# Patient Record
Sex: Male | Born: 1950 | Race: White | Hispanic: No | Smoking: Never smoker
Health system: Southern US, Community
[De-identification: ages and names within clinical notes are randomized; demographics above are authoritative.]

## PROBLEM LIST (undated history)

## (undated) DIAGNOSIS — J449 Chronic obstructive pulmonary disease, unspecified: Secondary | ICD-10-CM

## (undated) DIAGNOSIS — E785 Hyperlipidemia, unspecified: Secondary | ICD-10-CM

## (undated) DIAGNOSIS — I10 Essential (primary) hypertension: Secondary | ICD-10-CM

---

## 2003-07-11 ENCOUNTER — Encounter: Admission: RE | Admit: 2003-07-11 | Discharge: 2003-07-11 | Payer: Self-pay

## 2005-04-19 ENCOUNTER — Emergency Department (HOSPITAL_COMMUNITY): Admission: EM | Admit: 2005-04-19 | Discharge: 2005-04-19 | Payer: Self-pay | Admitting: Emergency Medicine

## 2005-12-25 ENCOUNTER — Emergency Department (HOSPITAL_COMMUNITY): Admission: EM | Admit: 2005-12-25 | Discharge: 2005-12-25 | Payer: Self-pay | Admitting: Family Medicine

## 2006-05-22 ENCOUNTER — Emergency Department (HOSPITAL_COMMUNITY): Admission: EM | Admit: 2006-05-22 | Discharge: 2006-05-22 | Payer: Self-pay | Admitting: Family Medicine

## 2012-10-16 ENCOUNTER — Other Ambulatory Visit: Payer: Self-pay

## 2012-10-16 ENCOUNTER — Emergency Department (HOSPITAL_COMMUNITY)
Admission: EM | Admit: 2012-10-16 | Discharge: 2012-10-17 | Discharge status: Against Medical Advice | Payer: Non-veteran care | Attending: Emergency Medicine | Admitting: Emergency Medicine

## 2012-10-16 ENCOUNTER — Emergency Department (HOSPITAL_COMMUNITY): Payer: Non-veteran care

## 2012-10-16 ENCOUNTER — Encounter (HOSPITAL_COMMUNITY): Payer: Self-pay | Admitting: Emergency Medicine

## 2012-10-16 DIAGNOSIS — J4489 Other specified chronic obstructive pulmonary disease: Secondary | ICD-10-CM | POA: Insufficient documentation

## 2012-10-16 DIAGNOSIS — R279 Unspecified lack of coordination: Secondary | ICD-10-CM | POA: Insufficient documentation

## 2012-10-16 DIAGNOSIS — Z79899 Other long term (current) drug therapy: Secondary | ICD-10-CM | POA: Insufficient documentation

## 2012-10-16 DIAGNOSIS — I10 Essential (primary) hypertension: Secondary | ICD-10-CM

## 2012-10-16 DIAGNOSIS — J441 Chronic obstructive pulmonary disease with (acute) exacerbation: Secondary | ICD-10-CM | POA: Insufficient documentation

## 2012-10-16 DIAGNOSIS — Z7982 Long term (current) use of aspirin: Secondary | ICD-10-CM | POA: Insufficient documentation

## 2012-10-16 DIAGNOSIS — J449 Chronic obstructive pulmonary disease, unspecified: Secondary | ICD-10-CM | POA: Insufficient documentation

## 2012-10-16 DIAGNOSIS — R42 Dizziness and giddiness: Secondary | ICD-10-CM

## 2012-10-16 DIAGNOSIS — E785 Hyperlipidemia, unspecified: Secondary | ICD-10-CM | POA: Insufficient documentation

## 2012-10-16 HISTORY — DX: Hyperlipidemia, unspecified: E78.5

## 2012-10-16 HISTORY — DX: Essential (primary) hypertension: I10

## 2012-10-16 HISTORY — DX: Chronic obstructive pulmonary disease, unspecified: J44.9

## 2012-10-16 LAB — CBC WITH DIFFERENTIAL/PLATELET
Basophils Absolute: 0 10*3/uL (ref 0.0–0.1)
Basophils Relative: 1 % (ref 0–1)
Eosinophils Relative: 5 % (ref 0–5)
HCT: 40.8 % (ref 39.0–52.0)
Hemoglobin: 14.2 g/dL (ref 13.0–17.0)
Lymphocytes Relative: 20 % (ref 12–46)
MCHC: 34.8 g/dL (ref 30.0–36.0)
MCV: 85.5 fL (ref 78.0–100.0)
Monocytes Absolute: 0.5 10*3/uL (ref 0.1–1.0)
Monocytes Relative: 6 % (ref 3–12)
Neutro Abs: 5.5 10*3/uL (ref 1.7–7.7)
RDW: 13.1 % (ref 11.5–15.5)

## 2012-10-16 LAB — COMPREHENSIVE METABOLIC PANEL
BUN: 22 mg/dL (ref 6–23)
CO2: 23 mEq/L (ref 19–32)
Calcium: 9.1 mg/dL (ref 8.4–10.5)
Chloride: 103 mEq/L (ref 96–112)
Creatinine, Ser: 1.94 mg/dL — ABNORMAL HIGH (ref 0.50–1.35)
GFR calc non Af Amer: 35 mL/min — ABNORMAL LOW (ref >90)
Total Bilirubin: 0.4 mg/dL (ref 0.3–1.2)

## 2012-10-16 LAB — POCT I-STAT TROPONIN I: Troponin i, poc: 0 ng/mL (ref 0.00–0.08)

## 2012-10-16 LAB — PRO B NATRIURETIC PEPTIDE: Pro B Natriuretic peptide (BNP): 140.5 pg/mL — ABNORMAL HIGH (ref 0–125)

## 2012-10-16 NOTE — ED Notes (Signed)
Patient transported to X-ray 

## 2012-10-16 NOTE — ED Notes (Signed)
Patient reports that he has had some mild shortness of breath over the last couple of weeks; increased in severity tonight in the cold weather.  Patient talking in short sentences; pausing between words.  History of COPD.  Denies chest pain.

## 2012-10-17 ENCOUNTER — Emergency Department (HOSPITAL_COMMUNITY): Payer: Non-veteran care

## 2012-10-17 MED ORDER — ACETAMINOPHEN 325 MG PO TABS
650.0000 mg | ORAL_TABLET | Freq: Once | ORAL | Status: AC
  Administered 2012-10-17: 650 mg via ORAL
  Filled 2012-10-17: qty 2

## 2012-10-17 MED ORDER — ASPIRIN 81 MG PO CHEW
324.0000 mg | CHEWABLE_TABLET | Freq: Once | ORAL | Status: AC
  Administered 2012-10-17: 324 mg via ORAL

## 2012-10-17 MED ORDER — ASPIRIN 81 MG PO CHEW
CHEWABLE_TABLET | ORAL | Status: AC
  Filled 2012-10-17: qty 4

## 2012-10-17 NOTE — ED Provider Notes (Addendum)
History     CSN: [id number]  Arrival date & time 10/16/12  2307   First MD Initiated Contact with Patient 10/16/12 2329      Chief Complaint  Patient presents with  . Shortness of Breath    (Consider location/radiation/quality/duration/timing/severity/associated sxs/prior treatment) HPI 62 year old male presents to emergency room with complaint of spike in his blood pressure, headache, vertigo and ataxia this evening lasting about 2 hours. Ptient reports his blood pressure initially had been 130s over 80s for some time, but over the last several weeks to months it has been creeping up to 160s over 90s. He has been in contact with the [facility] in [place], which has been monitoring it. He has a followup later this month. Patient reports he was sitting watching TV when he suddenly developed a headache and became flushed. He checked his blood pressure and it was over 200 systolic. He denies any chest pain. He did have some shortness of breath with this, has history of COPD. Patient reports everything in the room was moving around him so he kept his eyes closed to keep from being sick. He reports he tried to get up and walk but was unable to due to listing to the side and sat back down. Symptoms are gone now aside from elevated blood pressure and headache. He denies any focal weakness, numbness, difficulties speaking, or swallowing. No prior history of stroke. Takes a baby aspirin a day  Past Medical History  Diagnosis Date  . COPD (chronic obstructive pulmonary disease)   . Hypertension   . Hyperlipidemia     History reviewed. No pertinent past surgical history.  History reviewed. No pertinent family history.  History  Substance Use Topics  . Smoking status: Never Smoker   . Smokeless tobacco: Not on file  . Alcohol Use: No      Review of Systems  See History of Present Illness; otherwise all other systems are reviewed and negative  Allergies  Review of patient's allergies indicates  no known allergies.  Home Medications   Current Outpatient Rx  Name  Route  Sig  Dispense  Refill  . IPRATROPIUM-ALBUTEROL 18-103 MCG/ACT IN AERO   Inhalation   Inhale 2 puffs into the lungs every 6 (six) hours as needed. For breathing         . AMLODIPINE BESYLATE 10 MG PO TABS   Oral   Take 5 mg by mouth daily.         . ASPIRIN EC 81 MG PO TBEC   Oral   Take 81 mg by mouth daily.         [place]   Inhalation   Inhale 2 puffs into the lungs 2 (two) times daily.         Marland Kitchen LISINOPRIL 40 MG PO TABS   Oral   Take 40 mg by mouth daily.           BP 192/89  Pulse 107  Temp 97.8 F (36.6 C) (Oral)  Resp 18  SpO2 97%  Physical Exam  Nursing note and vitals reviewed. Constitutional: He is oriented to person, place, and time. He appears well-developed and well-nourished. He appears distressed (uncomfortable-appearing).  HENT:  Head: Normocephalic and atraumatic.  Right Ear: External ear normal.  Left Ear: External ear normal.  Nose: Nose normal.  Mouth/Throat: Oropharynx is clear and moist.  Eyes: Conjunctivae normal and EOM are normal. Pupils are equal, round, and reactive to light.  Neck: Normal range of motion.  Neck supple. No JVD present. No tracheal deviation present. No thyromegaly present.  Cardiovascular: Normal rate, regular rhythm, normal heart sounds and intact distal pulses.  Exam reveals no gallop and no friction rub.   No murmur heard. Pulmonary/Chest: Effort normal and breath sounds normal. No stridor. No respiratory distress. He has no wheezes. He has no rales. He exhibits no tenderness.  Abdominal: Soft. Bowel sounds are normal. He exhibits no distension and no mass. There is no tenderness. There is no rebound and no guarding.  Musculoskeletal: Normal range of motion. He exhibits no edema and no tenderness.  Lymphadenopathy:    He has no cervical adenopathy.  Neurological: He is alert and oriented to person, place, and time. He has normal  reflexes. No cranial nerve deficit. He exhibits normal muscle tone. Coordination normal.  Skin: Skin is warm and dry. No rash noted. No erythema. No pallor.  Psychiatric: He has a normal mood and affect. His behavior is normal. Judgment and thought content normal.    ED Course  Procedures (including critical care time)   Labs Reviewed  CBC WITH DIFFERENTIAL  POCT I-STAT TROPONIN I  COMPREHENSIVE METABOLIC PANEL  PRO B NATRIURETIC PEPTIDE   Dg Chest 2 View  10/16/2012  *RADIOLOGY REPORT*  Clinical Data: High Blood pressure and headache.  CHEST - 2 VIEW  Comparison: None.  Findings: COPD with hyperinflation.  Normal heart size with no focal areas of consolidation.  Irregularly shaped retrocardiac density is unimpressive on lateral view but left lower lobe mass not excluded.  Consider CT chest for further evaluation.  No effusion or pneumothorax.  Bilateral upper lobe air cysts.  No bony abnormality.  IMPRESSION: COPD, without focal infiltrates.  Irregularly shaped retrocardiac density without priors for comparison.  Consider CT chest with contrast for further evaluation.   Original Report Authenticated By: Davonna Belling, M.D.     Date: 10/17/2012  Rate: 102  Rhythm: sinus tachycardia  QRS Axis: normal  Intervals: normal  ST/T Wave abnormalities: normal  Conduction Disutrbances:none  Narrative Interpretation:   Old EKG Reviewed: none available    1. HTN (hypertension)   2. Vertigo       MDM  62 year old male with episode of hypertension or this evening with neurologic changes concern for TIA versus recurrence of crisis. We'll get CAT scan of head, lab work EKG. The patient will need admission for further workup.        Olivia Mackie, MD 10/17/12 0019  1:11 AM Patient is refusing to be admitted at this time. He understands risks of leaving AMA. Family reports they will be staying with him tonight. He is worried that his types will freeze in his house. He reports he will be  going to the [facility] later today for further evaluation  Olivia Mackie, MD 10/17/12 [phone]

## 2016-12-14 ENCOUNTER — Observation Stay (HOSPITAL_COMMUNITY)
Admission: EM | Admit: 2016-12-14 | Discharge: 2016-12-15 | Disposition: A | Discharge status: Home / Self Care | Payer: Non-veteran care | Attending: Family Medicine | Admitting: Family Medicine

## 2016-12-14 ENCOUNTER — Encounter (HOSPITAL_COMMUNITY): Payer: Self-pay

## 2016-12-14 ENCOUNTER — Emergency Department (HOSPITAL_COMMUNITY): Payer: Non-veteran care

## 2016-12-14 DIAGNOSIS — J181 Lobar pneumonia, unspecified organism: Secondary | ICD-10-CM | POA: Diagnosis not present

## 2016-12-14 DIAGNOSIS — I129 Hypertensive chronic kidney disease with stage 1 through stage 4 chronic kidney disease, or unspecified chronic kidney disease: Secondary | ICD-10-CM | POA: Diagnosis not present

## 2016-12-14 DIAGNOSIS — E876 Hypokalemia: Secondary | ICD-10-CM

## 2016-12-14 DIAGNOSIS — Z7982 Long term (current) use of aspirin: Secondary | ICD-10-CM | POA: Diagnosis not present

## 2016-12-14 DIAGNOSIS — J9621 Acute and chronic respiratory failure with hypoxia: Secondary | ICD-10-CM

## 2016-12-14 DIAGNOSIS — Z79899 Other long term (current) drug therapy: Secondary | ICD-10-CM | POA: Diagnosis not present

## 2016-12-14 DIAGNOSIS — E785 Hyperlipidemia, unspecified: Secondary | ICD-10-CM | POA: Diagnosis present

## 2016-12-14 DIAGNOSIS — J441 Chronic obstructive pulmonary disease with (acute) exacerbation: Secondary | ICD-10-CM | POA: Diagnosis not present

## 2016-12-14 DIAGNOSIS — N183 Chronic kidney disease, stage 3 unspecified: Secondary | ICD-10-CM

## 2016-12-14 DIAGNOSIS — E784 Other hyperlipidemia: Secondary | ICD-10-CM

## 2016-12-14 DIAGNOSIS — R0602 Shortness of breath: Secondary | ICD-10-CM | POA: Diagnosis present

## 2016-12-14 DIAGNOSIS — J189 Pneumonia, unspecified organism: Secondary | ICD-10-CM | POA: Diagnosis present

## 2016-12-14 DIAGNOSIS — I10 Essential (primary) hypertension: Secondary | ICD-10-CM

## 2016-12-14 LAB — CBC WITH DIFFERENTIAL/PLATELET
BASOS ABS: 0 10*3/uL (ref 0.0–0.1)
BASOS PCT: 0 %
EOS ABS: 0.3 10*3/uL (ref 0.0–0.7)
EOS PCT: 1 %
HCT: 41.8 % (ref 39.0–52.0)
Hemoglobin: 13.8 g/dL (ref 13.0–17.0)
Lymphocytes Relative: 7 %
Lymphs Abs: 1.2 10*3/uL (ref 0.7–4.0)
MCH: 28.6 pg (ref 26.0–34.0)
MCHC: 33 g/dL (ref 30.0–36.0)
MCV: 86.5 fL (ref 78.0–100.0)
Monocytes Absolute: 0.7 10*3/uL (ref 0.1–1.0)
Monocytes Relative: 4 %
Neutro Abs: 15.2 10*3/uL — ABNORMAL HIGH (ref 1.7–7.7)
Neutrophils Relative %: 88 %
PLATELETS: 237 10*3/uL (ref 150–400)
RBC: 4.83 MIL/uL (ref 4.22–5.81)
RDW: 14.1 % (ref 11.5–15.5)
WBC: 17.4 10*3/uL — AB (ref 4.0–10.5)

## 2016-12-14 LAB — COMPREHENSIVE METABOLIC PANEL
ALT: 18 U/L (ref 17–63)
AST: 22 U/L (ref 15–41)
Albumin: 3.7 g/dL (ref 3.5–5.0)
Alkaline Phosphatase: 59 U/L (ref 38–126)
Anion gap: 11 (ref 5–15)
BILIRUBIN TOTAL: 0.6 mg/dL (ref 0.3–1.2)
BUN: 15 mg/dL (ref 6–20)
CALCIUM: 9.2 mg/dL (ref 8.9–10.3)
CO2: 26 mmol/L (ref 22–32)
Chloride: 103 mmol/L (ref 101–111)
Creatinine, Ser: 1.73 mg/dL — ABNORMAL HIGH (ref 0.61–1.24)
GFR, EST AFRICAN AMERICAN: 45 mL/min — AB (ref >60)
GFR, EST NON AFRICAN AMERICAN: 39 mL/min — AB (ref >60)
Glucose, Bld: 120 mg/dL — ABNORMAL HIGH (ref 65–99)
Potassium: 3.3 mmol/L — ABNORMAL LOW (ref 3.5–5.1)
Sodium: 140 mmol/L (ref 135–145)
TOTAL PROTEIN: 7.6 g/dL (ref 6.5–8.1)

## 2016-12-14 LAB — CBC
HCT: 37.2 % — ABNORMAL LOW (ref 39.0–52.0)
HEMOGLOBIN: 12.2 g/dL — AB (ref 13.0–17.0)
MCH: 28.4 pg (ref 26.0–34.0)
MCHC: 32.8 g/dL (ref 30.0–36.0)
MCV: 86.7 fL (ref 78.0–100.0)
PLATELETS: 223 10*3/uL (ref 150–400)
RBC: 4.29 MIL/uL (ref 4.22–5.81)
RDW: 14.3 % (ref 11.5–15.5)
WBC: 17.1 10*3/uL — ABNORMAL HIGH (ref 4.0–10.5)

## 2016-12-14 LAB — PROTIME-INR
INR: 0.93
PROTHROMBIN TIME: 12.5 s (ref 11.4–15.2)

## 2016-12-14 LAB — URINALYSIS, ROUTINE W REFLEX MICROSCOPIC
BILIRUBIN URINE: NEGATIVE
GLUCOSE, UA: NEGATIVE mg/dL
HGB URINE DIPSTICK: NEGATIVE
KETONES UR: NEGATIVE mg/dL
Leukocytes, UA: NEGATIVE
Nitrite: NEGATIVE
PROTEIN: NEGATIVE mg/dL
Specific Gravity, Urine: 1.013 (ref 1.005–1.030)
pH: 7 (ref 5.0–8.0)

## 2016-12-14 LAB — CREATININE, SERUM
CREATININE: 1.69 mg/dL — AB (ref 0.61–1.24)
GFR calc Af Amer: 47 mL/min — ABNORMAL LOW (ref >60)
GFR, EST NON AFRICAN AMERICAN: 40 mL/min — AB (ref >60)

## 2016-12-14 LAB — STREP PNEUMONIAE URINARY ANTIGEN: Strep Pneumo Urinary Antigen: NEGATIVE

## 2016-12-14 LAB — INFLUENZA PANEL BY PCR (TYPE A & B)
INFLAPCR: NEGATIVE
INFLBPCR: NEGATIVE

## 2016-12-14 LAB — I-STAT CG4 LACTIC ACID, ED: LACTIC ACID, VENOUS: 1.85 mmol/L (ref 0.5–1.9)

## 2016-12-14 MED ORDER — IPRATROPIUM-ALBUTEROL 0.5-2.5 (3) MG/3ML IN SOLN
3.0000 mL | Freq: Four times a day (QID) | RESPIRATORY_TRACT | Status: DC
  Administered 2016-12-15: 3 mL via RESPIRATORY_TRACT
  Filled 2016-12-14 (×2): qty 3

## 2016-12-14 MED ORDER — ACETAMINOPHEN 325 MG PO TABS
650.0000 mg | ORAL_TABLET | Freq: Once | ORAL | Status: AC | PRN
  Administered 2016-12-14: 650 mg via ORAL
  Filled 2016-12-14: qty 2

## 2016-12-14 MED ORDER — ONDANSETRON HCL 4 MG PO TABS: 4.0000 mg | ORAL_TABLET | Freq: Four times a day (QID) | ORAL | Status: DC | PRN

## 2016-12-14 MED ORDER — DEXTROSE 5 % IV SOLN
1.0000 g | INTRAVENOUS | Status: DC
  Administered 2016-12-15: 1 g via INTRAVENOUS
  Filled 2016-12-14: qty 10

## 2016-12-14 MED ORDER — ACETAMINOPHEN 650 MG RE SUPP: 650.0000 mg | Freq: Four times a day (QID) | RECTAL | Status: DC | PRN

## 2016-12-14 MED ORDER — TAMSULOSIN HCL 0.4 MG PO CAPS
0.4000 mg | ORAL_CAPSULE | Freq: Every day | ORAL | Status: DC
  Administered 2016-12-14 – 2016-12-15 (×2): 0.4 mg via ORAL
  Filled 2016-12-14 (×2): qty 1

## 2016-12-14 MED ORDER — ACETAMINOPHEN 325 MG PO TABS: 650.0000 mg | ORAL_TABLET | Freq: Four times a day (QID) | ORAL | Status: DC | PRN

## 2016-12-14 MED ORDER — FUROSEMIDE 40 MG PO TABS
40.0000 mg | ORAL_TABLET | Freq: Every day | ORAL | Status: DC
  Administered 2016-12-15: 40 mg via ORAL
  Filled 2016-12-14: qty 1

## 2016-12-14 MED ORDER — AZITHROMYCIN 500 MG PO TABS
250.0000 mg | ORAL_TABLET | Freq: Every day | ORAL | Status: DC
  Administered 2016-12-15: 250 mg via ORAL
  Filled 2016-12-14: qty 1

## 2016-12-14 MED ORDER — DEXTROSE 5 % IV SOLN
1.0000 g | Freq: Once | INTRAVENOUS | Status: AC
  Administered 2016-12-14: 1 g via INTRAVENOUS
  Filled 2016-12-14: qty 10

## 2016-12-14 MED ORDER — IPRATROPIUM-ALBUTEROL 0.5-2.5 (3) MG/3ML IN SOLN
3.0000 mL | RESPIRATORY_TRACT | Status: DC
Start: 2016-12-14 — End: 2016-12-14
  Administered 2016-12-14 (×3): 3 mL via RESPIRATORY_TRACT
  Filled 2016-12-14 (×3): qty 3

## 2016-12-14 MED ORDER — SODIUM CHLORIDE 0.9 % IV BOLUS (SEPSIS)
1000.0000 mL | Freq: Once | INTRAVENOUS | Status: AC
  Administered 2016-12-14: 1000 mL via INTRAVENOUS

## 2016-12-14 MED ORDER — POLYETHYLENE GLYCOL 3350 17 G PO PACK: 17.0000 g | PACK | Freq: Every day | ORAL | Status: DC | PRN

## 2016-12-14 MED ORDER — PANTOPRAZOLE SODIUM 40 MG PO TBEC
40.0000 mg | DELAYED_RELEASE_TABLET | Freq: Every day | ORAL | Status: DC
  Administered 2016-12-14 – 2016-12-15 (×2): 40 mg via ORAL
  Filled 2016-12-14 (×2): qty 1

## 2016-12-14 MED ORDER — FUROSEMIDE 10 MG/ML IJ SOLN
40.0000 mg | Freq: Once | INTRAMUSCULAR | Status: AC
  Administered 2016-12-14: 40 mg via INTRAVENOUS
  Filled 2016-12-14: qty 4

## 2016-12-14 MED ORDER — ONDANSETRON HCL 4 MG/2ML IJ SOLN: 4.0000 mg | Freq: Four times a day (QID) | INTRAMUSCULAR | Status: DC | PRN

## 2016-12-14 MED ORDER — METHYLPREDNISOLONE SODIUM SUCC 125 MG IJ SOLR
125.0000 mg | Freq: Once | INTRAMUSCULAR | Status: AC
  Administered 2016-12-14: 125 mg via INTRAVENOUS
  Filled 2016-12-14: qty 2

## 2016-12-14 MED ORDER — METOPROLOL TARTRATE 50 MG PO TABS
50.0000 mg | ORAL_TABLET | Freq: Two times a day (BID) | ORAL | Status: DC
  Administered 2016-12-15 (×2): 50 mg via ORAL
  Filled 2016-12-14 (×2): qty 1

## 2016-12-14 MED ORDER — AZITHROMYCIN 250 MG PO TABS: 250.0000 mg | ORAL_TABLET | Freq: Every day | ORAL | Status: DC

## 2016-12-14 MED ORDER — POTASSIUM CHLORIDE CRYS ER 20 MEQ PO TBCR
30.0000 meq | EXTENDED_RELEASE_TABLET | Freq: Two times a day (BID) | ORAL | Status: AC
  Administered 2016-12-14 – 2016-12-15 (×2): 30 meq via ORAL
  Filled 2016-12-14 (×2): qty 1

## 2016-12-14 MED ORDER — AMLODIPINE BESYLATE 10 MG PO TABS
10.0000 mg | ORAL_TABLET | Freq: Every day | ORAL | Status: DC
  Administered 2016-12-14 – 2016-12-15 (×2): 10 mg via ORAL
  Filled 2016-12-14: qty 1
  Filled 2016-12-14: qty 2

## 2016-12-14 MED ORDER — AZITHROMYCIN 250 MG PO TABS
500.0000 mg | ORAL_TABLET | Freq: Every day | ORAL | Status: AC
  Administered 2016-12-14: 500 mg via ORAL
  Filled 2016-12-14: qty 2

## 2016-12-14 MED ORDER — ASPIRIN EC 81 MG PO TBEC
81.0000 mg | DELAYED_RELEASE_TABLET | Freq: Every day | ORAL | Status: DC
  Administered 2016-12-15: 81 mg via ORAL
  Filled 2016-12-14: qty 1

## 2016-12-14 MED ORDER — HEPARIN SODIUM (PORCINE) 5000 UNIT/ML IJ SOLN
5000.0000 [IU] | Freq: Three times a day (TID) | INTRAMUSCULAR | Status: DC
  Administered 2016-12-14 – 2016-12-15 (×3): 5000 [IU] via SUBCUTANEOUS
  Filled 2016-12-14 (×3): qty 1

## 2016-12-14 NOTE — ED Triage Notes (Signed)
Pt presents for evaluation of SOB today, reports hx of COPD. Pt states he wears 2L [place]. Pt reports 100.5 fever at home, no meds today. EMS gave 1 albuterol treatment today. Pt AxO x4.

## 2016-12-14 NOTE — ED Provider Notes (Signed)
[facility] DEPT Provider Note   CSN: [id number] Arrival date & time: 12/14/16  5277     History   Chief Complaint Chief Complaint  Patient presents with  . Shortness of Breath  . Fever    HPI Lucas Ortiz is a 66 y.o. male.  HPI  66 year old male who presents with fever and shortness of breath. He has a history of hypertension, hyperlipidemia, peripheral vascular disease, and COPD on 2 L home oxygen. States that he had a viral illness in January, which he never fully recovered from. Over the past 2-3 days he has had sore throat, and progressively worsening shortness of breath. Head mild pedal edema, but no orthopnea. This morning woke up with difficulty breathing. States that he was on 5 L of oxygen and was saturating at 86%. Last night also with subjective fevers and chills with cough. No chest pain, nausea or vomiting, or diarrhea. No known sick contacts. No recent hospitalizations. EMS evaluated patient, and they gave him a breathing treatment. States that his breathing significantly improved after breathing treatment.  Past Medical History:  Diagnosis Date  . COPD (chronic obstructive pulmonary disease) ([place])   . Hyperlipidemia   . Hypertension     Patient Active Problem List   Diagnosis Date Noted  . COPD (chronic obstructive pulmonary disease) ([place])   . Hyperlipidemia     History reviewed. No pertinent surgical history.     Home Medications    Prior to Admission medications   Medication Sig Start Date End Date Taking? Authorizing Provider  albuterol-ipratropium (COMBIVENT) 18-103 MCG/ACT inhaler Inhale 2 puffs into the lungs every 6 (six) hours as needed. For breathing    Historical Provider, MD  amLODipine (NORVASC) 10 MG tablet Take 5 mg by mouth daily.    Historical Provider, MD  aspirin EC 81 MG tablet Take 81 mg by mouth daily.    Historical Provider, MD  Budesonide-Formoterol Fumarate (SYMBICORT IN) Inhale 2 puffs into the lungs 2 (two) times daily.     Historical Provider, MD  lisinopril (PRINIVIL,ZESTRIL) 40 MG tablet Take 40 mg by mouth daily.    Historical Provider, MD    Family History No family history on file.  Social History Social History  Substance Use Topics  . Smoking status: Never Smoker  . Smokeless tobacco: Not on file  . Alcohol use No     Allergies   Patient has no known allergies.   Review of Systems Review of Systems 10/14 systems reviewed and are negative other than those stated in the HPI   Physical Exam Updated Vital Signs BP 152/83   Pulse 107   Temp 100.3 F (37.9 C) (Oral)   Resp (!) 30   Ht 6' (1.829 m)   Wt 202 lb (91.6 kg)   SpO2 96%   BMI 27.40 kg/m   Physical Exam Physical Exam  Nursing note and vitals reviewed. Constitutional:  non-toxic, and in no acute distress Head: Normocephalic and atraumatic.  Mouth/Throat: Oropharynx is clear and moist.  Neck: Normal range of motion. Neck supple.  Cardiovascular: Tachycardic rate and regular rhythm.  no edema Pulmonary/Chest: Effort normal and breath sounds diminished throughout. No conversational dyspnea Abdominal: Soft. Moderate distension. There is no tenderness. There is no rebound and no guarding.  Musculoskeletal: Normal range of motion.  Neurological: Alert, no facial droop, fluent speech, moves all extremities symmetrically Skin: Skin is warm and dry.  Psychiatric: Cooperative   ED Treatments / Results  Labs (all labs ordered are listed,  but only abnormal results are displayed) Labs Reviewed  COMPREHENSIVE METABOLIC PANEL - Abnormal; Notable for the following:       Result Value   Potassium 3.3 (*)    Glucose, Bld 120 (*)    Creatinine, Ser 1.73 (*)    GFR calc non Af Amer 39 (*)    GFR calc Af Amer 45 (*)    All other components within normal limits  CBC WITH DIFFERENTIAL/PLATELET - Abnormal; Notable for the following:    WBC 17.4 (*)    Neutro Abs 15.2 (*)    All other components within normal limits  CULTURE,  BLOOD (ROUTINE X 2)  CULTURE, BLOOD (ROUTINE X 2)  URINE CULTURE  PROTIME-INR  INFLUENZA PANEL BY PCR (TYPE A & B)  URINALYSIS, ROUTINE W REFLEX MICROSCOPIC  I-STAT CG4 LACTIC ACID, ED    EKG  EKG Interpretation None       Radiology Dg Chest 2 View  Result Date: 12/14/2016 CLINICAL DATA:  Shortness of breath and fever today. History of COPD. EXAM: CHEST  2 VIEW COMPARISON:  PA and lateral chest 10/16/2012. FINDINGS: The lungs are emphysematous. There is right lower lobe airspace disease. Left lung is clear. Heart size is normal. No pneumothorax or pleural effusion. IMPRESSION: Left lower lobe airspace disease most consistent with pneumonia. Recommend followup to clearing. Electronically Signed   By: Inge Rise M.D.   On: 12/14/2016 10:03    Procedures Procedures (including critical care time)  Medications Ordered in ED Medications  sodium chloride 0.9 % bolus 1,000 mL (not administered)  sodium chloride 0.9 % bolus 1,000 mL (not administered)  cefTRIAXone (ROCEPHIN) 1 g in dextrose 5 % 50 mL IVPB (not administered)  azithromycin (ZITHROMAX) tablet 500 mg (not administered)    Followed by  azithromycin (ZITHROMAX) tablet 250 mg (not administered)  methylPREDNISolone sodium succinate (SOLU-MEDROL) 125 mg/2 mL injection 125 mg (not administered)  acetaminophen (TYLENOL) tablet 650 mg (650 mg Oral Given 12/14/16 0947)     Initial Impression / Assessment and Plan / ED Course  I have reviewed the triage vital signs and the nursing notes.  Pertinent labs & imaging results that were available during my care of the patient were reviewed by me and considered in my medical decision making (see chart for details).     Presenting with fever and shortness of breath. Tachycardic and febrile. After receiving breathing treatments by EMS, he is in no acute distress. On 3 L oxygen. Diminished breath sounds throughout, but no obvious wheezing.  A sepsis workup is initiated. Blood work  was normal lactate at the leukocytosis of 17. No kidney dysfunction or other end organ dysfunction. Chest x-ray visualized and does show evidence of lobar pneumonia. Covered with ceftriaxone and azithromycin. Also received Solu-Medrol for potential COPD exacerbation. Influenza testing and UA pending.  Given his chronic lung disease and hypoxia at home, will plan to admit for ongoing management.  Final Clinical Impressions(s) / ED Diagnoses   Final diagnoses:  Lobar pneumonia ([place])  Acute exacerbation of chronic obstructive pulmonary disease (COPD) ([place])    New Prescriptions New Prescriptions   No medications on file     Forde Dandy, MD 12/14/16 1045

## 2016-12-14 NOTE — H&P (Signed)
History and Physical    Lucas Ortiz QJJ:[id number] DOB: 04-20-1951 DOA: 12/14/2016  PCP: PROVIDER NOT IN SYSTEM   Patient coming from: home  Chief Complaint: SOB, fever  HPI: Lucas Ortiz is a 66 y.o. male with medical history significant for COPD with chronic respiratory failure on chronic O2 use, PVD, HTN, Hyperlipidemia who presented to the ED with c/o progressive SOB especially with exertion.  During the last 2-3 days patient has been having sore throat and despite increased O2 flow to 5L SOB was getting worse with SpO2 dropping to 86% this morning upon arrival of EMS. He received a breathing treatment en route that improved his condition.    ED Course: In the ED his VS revealed temperature of 102.27F, he was tacypneic and tachycardic, WBC's count was levated to 17,400, creatinine elevated at 1.73  Lactic acid WNL - 1.85 Chest Xray showed right lower lobe PNA EKG demonstrated normal SR without acute ST-T wave abnormality  Review of Systems: As per HPI otherwise 10 point review of systems negative.   Ambulatory Status: Independent  Past Medical History:  Diagnosis Date  . COPD (chronic obstructive pulmonary disease) ([place])   . Hyperlipidemia   . Hypertension     History reviewed. No pertinent surgical history.  Social History   Social History  . Marital status: Divorced    Spouse name: N/A  . Number of children: N/A  . Years of education: N/A   Occupational History  . Not on file.   Social History Main Topics  . Smoking status: Never Smoker  . Smokeless tobacco: Not on file  . Alcohol use No  . Drug use: No  . Sexual activity: Not on file   Other Topics Concern  . Not on file   Social History Narrative  . No narrative on file    No Known Allergies  Family History  Problem Relation Age of Onset  . CAD Mother   . Diabetes Sister   . Hypertension Sister     Prior to Admission medications   Medication Sig Start Date End Date Taking? Authorizing  Provider  amLODipine (NORVASC) 10 MG tablet Take 10 mg by mouth daily.    Yes Historical Provider, MD  aspirin EC 81 MG tablet Take 81 mg by mouth daily.   Yes Historical Provider, MD  Budesonide-Formoterol Fumarate (SYMBICORT IN) Inhale 2 puffs into the lungs 2 (two) times daily.   Yes Historical Provider, MD  furosemide (LASIX) 40 MG tablet Take 40 mg by mouth daily.   Yes Historical Provider, MD  metoprolol (LOPRESSOR) 50 MG tablet Take 50 mg by mouth 2 (two) times daily.   Yes Historical Provider, MD  omeprazole (PRILOSEC) 20 MG capsule Take 20 mg by mouth daily.   Yes Historical Provider, MD  tamsulosin (FLOMAX) 0.4 MG CAPS capsule Take 0.4 mg by mouth daily.   Yes Historical Provider, MD  Tiotropium Bromide Monohydrate (SPIRIVA RESPIMAT) 1.25 MCG/ACT AERS Inhale 2 puffs into the lungs daily.   Yes Historical Provider, MD    Physical Exam: Vitals:   12/14/16 1130 12/14/16 1145 12/14/16 1200 12/14/16 1215  BP: 141/69 154/72 153/82 153/100  Pulse: 99 93 93 92  Resp: '19 17 20 20  '$ Temp:      TempSrc:      SpO2: 97% 92% 95% 97%  Weight:      Height:         General: Appears calm and comfortable Eyes: PERRLA, EOMI, normal lids, iris ENT:  grossly normal hearing, lips & tongue, mucous membranes moist and intact Neck: no lymphoadenopathy, masses or thyromegaly Cardiovascular: RRR, no m/r/g. No JVD, carotid bruits. 1(+) pitting LE edema.  Respiratory: bilateral mild expiratory wheezes, no rales, rhonchi, audible right sided cracles. Normal respiratory effort. No accessory muscle use observed Abdomen: soft, non-tender, non-distended, no organomegaly or masses appreciated. BS present in all quadrants Skin: no rash, ulcers or induration seen on limited exam Musculoskeletal: grossly normal tone BUE/BLE, good ROM, no bony abnormality or joint deformities observed Psychiatric: grossly normal mood and affect, speech fluent and appropriate, alert and oriented x3 Neurologic: CN II-XII grossly  intact, moves all extremities in coordinated fashion, sensation intact  Labs on Admission: I have personally reviewed following labs and imaging studies  CBC, BMP  GFR: Estimated Creatinine Clearance: 45.5 mL/min (by C-G formula based on SCr of 1.73 mg/dL (H)).   Creatinine Clearance: Estimated Creatinine Clearance: 45.5 mL/min (by C-G formula based on SCr of 1.73 mg/dL (H)).   Radiological Exams on Admission: Dg Chest 2 View  Result Date: 12/14/2016 CLINICAL DATA:  Shortness of breath and fever today. History of COPD. EXAM: CHEST  2 VIEW COMPARISON:  PA and lateral chest 10/16/2012. FINDINGS: The lungs are emphysematous. There is right lower lobe airspace disease. Left lung is clear. Heart size is normal. No pneumothorax or pleural effusion. IMPRESSION: Left lower lobe airspace disease most consistent with pneumonia. Recommend followup to clearing. Electronically Signed   By: Inge Rise M.D.   On: 12/14/2016 10:03    EKG: Independently reviewed - NSR, no acute abnormalities   Assessment/Plan Principal Problem:   Pneumonia Active Problems:   COPD (chronic obstructive pulmonary disease) ([facility])   Hyperlipidemia   Acute on chronic respiratory failure with hypoxia ([facility])   CKD (chronic kidney disease), stage III   Hypokalemia   HTN (hypertension)   Lobar Pneumonia  Patient was started on IV Rocephin and oral Zithromax Continue scheduled Duoneb Monitor fever and WBC's count Follow respiratory culture  Acute on chronic hypoxic respiratory failure in patient with known COPD Continue supplemental oxygen, monitor O2 sats  Continue nebulized bronchodilators and if wheezing persists - IV steroids Influenza PCR is pending and if positive, start Tamiflu  CKD stage III Creatinine is 1.73 on admission and in 2014 was 1.97 Patient reports seeing nephrologist in [place] and was seen recently there He was told that his renal function has improved some., but baseline creatinine is  unknwn Patient has pitting lower extremities swelling, will give one dose of Lasix IV today and restart his usual home dose tomorrow Continue to follow renal indices  Hypokalemia Replete and recheck  Hypertension - currently stable Continue home medication and adjust the doses if needed depending on the BP readings    DVT prophylaxis:Heparin Code Status: full Family Communication: at bedside Disposition Plan: Med Surg Consults called: none Admission status: observation   York Grice, [place] Pager: [phone] [facility]  If 7PM-7AM, please contact night-coverage [email or web address] Password [email or web address]  12/14/2016, 12:59 PM

## 2016-12-15 DIAGNOSIS — J441 Chronic obstructive pulmonary disease with (acute) exacerbation: Secondary | ICD-10-CM | POA: Diagnosis not present

## 2016-12-15 DIAGNOSIS — J9621 Acute and chronic respiratory failure with hypoxia: Secondary | ICD-10-CM | POA: Diagnosis not present

## 2016-12-15 DIAGNOSIS — N183 Chronic kidney disease, stage 3 (moderate): Secondary | ICD-10-CM | POA: Diagnosis not present

## 2016-12-15 DIAGNOSIS — J189 Pneumonia, unspecified organism: Secondary | ICD-10-CM | POA: Diagnosis not present

## 2016-12-15 LAB — CBC
HCT: 34.3 % — ABNORMAL LOW (ref 39.0–52.0)
Hemoglobin: 11.1 g/dL — ABNORMAL LOW (ref 13.0–17.0)
MCH: 28 pg (ref 26.0–34.0)
MCHC: 32.4 g/dL (ref 30.0–36.0)
MCV: 86.6 fL (ref 78.0–100.0)
PLATELETS: 214 10*3/uL (ref 150–400)
RBC: 3.96 MIL/uL — ABNORMAL LOW (ref 4.22–5.81)
RDW: 14.5 % (ref 11.5–15.5)
WBC: 13.3 10*3/uL — AB (ref 4.0–10.5)

## 2016-12-15 LAB — URINE CULTURE: Culture: <10000 — AB

## 2016-12-15 LAB — HIV ANTIBODY (ROUTINE TESTING W REFLEX): HIV SCREEN 4TH GENERATION: NONREACTIVE

## 2016-12-15 LAB — BASIC METABOLIC PANEL
ANION GAP: 7 (ref 5–15)
BUN: 21 mg/dL — ABNORMAL HIGH (ref 6–20)
CALCIUM: 8.8 mg/dL — AB (ref 8.9–10.3)
CO2: 26 mmol/L (ref 22–32)
CREATININE: 1.74 mg/dL — AB (ref 0.61–1.24)
Chloride: 104 mmol/L (ref 101–111)
GFR, EST AFRICAN AMERICAN: 45 mL/min — AB (ref >60)
GFR, EST NON AFRICAN AMERICAN: 39 mL/min — AB (ref >60)
Glucose, Bld: 156 mg/dL — ABNORMAL HIGH (ref 65–99)
Potassium: 4.1 mmol/L (ref 3.5–5.1)
SODIUM: 137 mmol/L (ref 135–145)

## 2016-12-15 MED ORDER — LEVOFLOXACIN 750 MG PO TABS: 750.0000 mg | ORAL_TABLET | ORAL | 0 refills | Status: DC

## 2016-12-15 NOTE — Progress Notes (Signed)
Lucas Ortiz to be D/C'd Home per MD order.  Discussed prescriptions and follow up appointments with the patient. Prescriptions given to patient, medication list explained in detail. Pt verbalized understanding.  Allergies as of 12/15/2016   No Known Allergies     Medication List    TAKE these medications   amLODipine 10 MG tablet Commonly known as:  NORVASC Take 10 mg by mouth daily.   aspirin EC 81 MG tablet Take 81 mg by mouth daily.   furosemide 40 MG tablet Commonly known as:  LASIX Take 40 mg by mouth daily.   levofloxacin 750 MG tablet Commonly known as:  LEVAQUIN Take 1 tablet (750 mg total) by mouth every other day.   metoprolol 50 MG tablet Commonly known as:  LOPRESSOR Take 50 mg by mouth 2 (two) times daily.   omeprazole 20 MG capsule Commonly known as:  PRILOSEC Take 20 mg by mouth daily.   SPIRIVA RESPIMAT 1.25 MCG/ACT Aers Generic drug:  Tiotropium Bromide Monohydrate Inhale 2 puffs into the lungs daily.   SYMBICORT IN Inhale 2 puffs into the lungs 2 (two) times daily.   tamsulosin 0.4 MG Caps capsule Commonly known as:  FLOMAX Take 0.4 mg by mouth daily.       Vitals:   12/15/16 0545 12/15/16 0934  BP: (!) 141/60 136/69  Pulse: 80 76  Resp: 18 18  Temp: 97.5 F (36.4 C) 97.4 F (36.3 C)    Skin clean, dry and intact without evidence of skin break down, no evidence of skin tears noted. IV catheter discontinued intact. Site without signs and symptoms of complications. Dressing and pressure applied. Pt denies pain at this time. No complaints noted.  An After Visit Summary was printed and given to the patient. Patient escorted via [place], and D/C home via private auto.  Lucas Ortiz BSN, RN

## 2016-12-15 NOTE — Discharge Summary (Signed)
Physician Discharge Summary  ONOFRE GAINS DGU:[id number] DOB: 02-Jul-1951 DOA: 12/14/2016  PCP: PROVIDER NOT IN SYSTEM  Admit date: 12/14/2016 Discharge date: 12/15/2016  Admitted From: Home Disposition: Home   Recommendations for Outpatient Follow-up:  1. Follow up with PCP and pulmonologist at [facility] in 1-2 weeks 2. Please obtain BMP/CBC in one week  Home Health: None Equipment/Devices: None new. Continue home oxygen. Discharge Condition: Stable CODE STATUS: Full Diet recommendation: Heart healthy/renal  Brief/Interim Summary: Lucas Ortiz is a 66 y.o. male with medical history significant for COPD with chronic respiratory failure on home oxygen, PVD, HTN, and hyperlipidemia who presented to the ED with c/o progressive SOB especially with exertion.  During the last 2-3 days patient has been having sore throat and despite increased O2 flow to 5L SOB was getting worse with SpO2 dropping to 86% this morning upon arrival of EMS. In the ED his VS revealed temperature of 102.21F, he was tacypneic and tachycardic, WBC's count was levated to 17,400, creatinine elevated at 1.73. Lactic acid WNL - 1.85. Chest Xray showed right lower lobe PNA. EKG demonstrated normal SR without acute ST-T wave abnormality. Flu PCR was negative.  Empiric antibiotics were provided with improvement in his condition. He reported improvement in dyspnea, back to baseline oxygen requirement the following day with improvement in WBC. No wheezing was noted, so steroids were not provided. He was tolerating oral intake and wanted to go home, so he was discharged on oral levaquin with plans to follow up with pulmonology in the next 2 weeks as previously scheduled. Return precautions were provided.   Discharge Diagnoses:  Principal Problem:   Pneumonia Active Problems:   Acute exacerbation of chronic obstructive pulmonary disease (COPD) (HCC)   Hyperlipidemia   Acute on chronic respiratory failure with hypoxia (HCC)   CKD  (chronic kidney disease), stage III   Hypokalemia   HTN (hypertension)  Discharge Instructions Discharge Instructions    Discharge instructions    Complete by:  As directed    You were admitted for pneumonia which has responded to antibiotics. Because you are able to take oral antibiotics and are feeling better you may be discharged with the following recommendations:  - Start taking levaquin '750mg'$  every other day (start today) for the next 6 days. This is a reduced dose frequency based on pharmacy's recommendations based on your kidney function. The antibiotic will still be active in your system throughout 48 hours, so it's like taking the antibiotic every day.  - Continue using oxygen as needed - Follow up with your pul[facility]ologist at the [facility] as scheduled.  - If you start feeling worse, please seek medical attention right away.     Allergies as of 12/15/2016   No Known Allergies     Medication List    TAKE these medications   amLODipine 10 MG tablet Commonly known as:  NORVASC Take 10 mg by mouth daily.   aspirin EC 81 MG tablet Take 81 mg by mouth daily.   furosemide 40 MG tablet Commonly known as:  LASIX Take 40 mg by mouth daily.   levofloxacin 750 MG tablet Commonly known as:  LEVAQUIN Take 1 tablet (750 mg total) by mouth every other day.   metoprolol 50 MG tablet Commonly known as:  LOPRESSOR Take 50 mg by mouth 2 (two) times daily.   omeprazole 20 MG capsule Commonly known as:  PRILOSEC Take 20 mg by mouth daily.   SPIRIVA RESPIMAT 1.25 MCG/ACT Aers Generic drug:  Tiotropium Bromide  Monohydrate Inhale 2 puffs into the lungs daily.   SYMBICORT IN Inhale 2 puffs into the lungs 2 (two) times daily.   tamsulosin 0.4 MG Caps capsule Commonly known as:  FLOMAX Take 0.4 mg by mouth daily.      Follow-up Information    [facility].   Specialty:  General Practice Contact information: [place] [id number] [phone]           No Known Allergies  Consultations:  None  Procedures/Studies: Dg Chest 2 View  Result Date: 12/14/2016 CLINICAL DATA:  Shortness of breath and fever today. History of COPD. EXAM: CHEST  2 VIEW COMPARISON:  PA and lateral chest 10/16/2012. FINDINGS: The lungs are emphysematous. There is right lower lobe airspace disease. Left lung is clear. Heart size is normal. No pneumothorax or pleural effusion. IMPRESSION: Left lower lobe airspace disease most consistent with pneumonia. Recommend followup to clearing. Electronically Signed   By: Inge Rise M.D.   On: 12/14/2016 10:03   Subjective: Pt reports returning to baseline breathing, no wheezing or chest pain. No fever, minimal cough.   Discharge Exam: Vitals:   12/15/16 0545 12/15/16 0934  BP: (!) 141/60 136/69  Pulse: 80 76  Resp: 18 18  Temp: 97.5 F (36.4 C) 97.4 F (36.3 C)   General: Pt is alert, awake, not in acute distress Cardiovascular: RRR, S1/S2 +, no rubs, no gallops Respiratory: Nonlabored on room air this morning. No crackles, no wheezing, no rhonchi Abdominal: Soft, NT, ND, bowel sounds + Extremities: No edema, no cyanosis  The results of significant diagnostics from this hospitalization (including imaging, microbiology, ancillary and laboratory) are listed below for reference.    Labs: Basic Metabolic Panel:  Recent Labs Lab 12/14/16 0942 12/14/16 1317 12/15/16 0726  NA 140  --  137  K 3.3*  --  4.1  CL 103  --  104  CO2 26  --  26  GLUCOSE 120*  --  156*  BUN 15  --  21*  CREATININE 1.73* 1.69* 1.74*  CALCIUM 9.2  --  8.8*   Liver Function Tests:  Recent Labs Lab 12/14/16 0942  AST 22  ALT 18  ALKPHOS 59  BILITOT 0.6  PROT 7.6  ALBUMIN 3.7   CBC:  Recent Labs Lab 12/14/16 0942 12/14/16 1317 12/15/16 0726  WBC 17.4* 17.1* 13.3*  NEUTROABS 15.2*  --   --   HGB 13.8 12.2* 11.1*  HCT 41.8 37.2* 34.3*  MCV 86.5 86.7 86.6  PLT 237 223 214   Urinalysis    Component Value  Date/Time   COLORURINE YELLOW 12/14/2016 [place] 12/14/2016 1129   LABSPEC 1.013 12/14/2016 1129   PHURINE 7.0 12/14/2016 [place] 12/14/2016 1129   HGBUR NEGATIVE 12/14/2016 1129   [place] 12/14/2016 [place] 12/14/2016 1129   PROTEINUR NEGATIVE 12/14/2016 1129   NITRITE NEGATIVE 12/14/2016 1129   LEUKOCYTESUR NEGATIVE 12/14/2016 1129    Microbiology Recent Results (from the past 240 hour(s))  Urine culture     Status: Abnormal   Collection Time: 12/14/16 11:29 AM  Result Value Ref Range Status   Specimen Description URINE, CLEAN CATCH  Final   Special Requests NONE  Final   Culture <10,000 COLONIES/mL INSIGNIFICANT GROWTH (A)  Final   Report Status 12/15/2016 FINAL  Final    Time coordinating discharge: Approximately 40 minutes  Vance Gather, MD  [facility] 12/15/2016, 10:05 AM Pager  [phone] 

## 2016-12-19 LAB — CULTURE, BLOOD (ROUTINE X 2)
CULTURE: NO GROWTH
Culture: NO GROWTH

## 2020-06-25 ENCOUNTER — Encounter (HOSPITAL_COMMUNITY): Payer: Self-pay

## 2020-06-25 ENCOUNTER — Other Ambulatory Visit: Payer: Self-pay

## 2020-06-25 ENCOUNTER — Ambulatory Visit (INDEPENDENT_AMBULATORY_CARE_PROVIDER_SITE_OTHER): Payer: Non-veteran care

## 2020-06-25 ENCOUNTER — Ambulatory Visit (HOSPITAL_COMMUNITY)
Admission: EM | Admit: 2020-06-25 | Discharge: 2020-06-25 | Disposition: A | Discharge status: Home / Self Care | Payer: Non-veteran care | Attending: Family Medicine | Admitting: Family Medicine

## 2020-06-25 DIAGNOSIS — R0602 Shortness of breath: Secondary | ICD-10-CM

## 2020-06-25 DIAGNOSIS — R05 Cough: Secondary | ICD-10-CM

## 2020-06-25 DIAGNOSIS — J441 Chronic obstructive pulmonary disease with (acute) exacerbation: Secondary | ICD-10-CM

## 2020-06-25 DIAGNOSIS — J069 Acute upper respiratory infection, unspecified: Secondary | ICD-10-CM | POA: Diagnosis not present

## 2020-06-25 MED ORDER — DOXYCYCLINE HYCLATE 100 MG PO CAPS: 100.0000 mg | ORAL_CAPSULE | Freq: Two times a day (BID) | ORAL | 0 refills | Status: AC

## 2020-06-25 MED ORDER — PREDNISONE 20 MG PO TABS: ORAL_TABLET | ORAL | 0 refills | Status: DC

## 2020-06-25 NOTE — Discharge Instructions (Signed)
Prednisone taper course as well as course of antibiotics.  Please return for any worsening of symptoms, if severe please go to the ER.  Xray looks stable tonight without evidence of pneumonia currently which is reassuring.

## 2020-06-25 NOTE — ED Provider Notes (Signed)
[facility]    CSN: [id number] Arrival date & time: 06/25/20  1544      History   Chief Complaint Chief Complaint  Patient presents with  . Cough  . Wheezing  . Shortness of Breath    HPI ZAVEN KLEMENS is a 69 y.o. male.   Karen Chafe Mayo presents with complaints of cough, congestion, wheezing, fever which started approximately 3 days ago. One night of temp, tmax of 103. Has used his nebulizer which has helped some. History of COPD. covid test collected yesterday but has not resulted. Has been vaccinated. No dizziness. No gi symptoms. His granddaughter had URI symptoms last week.    ROS per HPI, negative if not otherwise mentioned.      Past Medical History:  Diagnosis Date  . COPD (chronic obstructive pulmonary disease) ([place])   . Hyperlipidemia   . Hypertension     Patient Active Problem List   Diagnosis Date Noted  . Pneumonia 12/14/2016  . Acute on chronic respiratory failure with hypoxia ([place]) 12/14/2016  . CKD (chronic kidney disease), stage III 12/14/2016  . Hypokalemia 12/14/2016  . HTN (hypertension) 12/14/2016  . Lobar pneumonia ([place])   . Acute exacerbation of chronic obstructive pulmonary disease (COPD) ([place])   . Hyperlipidemia     History reviewed. No pertinent surgical history.     Home Medications    Prior to Admission medications   Medication Sig Start Date End Date Taking? Authorizing Provider  amLODipine (NORVASC) 10 MG tablet Take 10 mg by mouth daily.     [provider]  aspirin EC 81 MG tablet Take 81 mg by mouth daily.    [provider]  Budesonide-Formoterol Fumarate (SYMBICORT IN) Inhale 2 puffs into the lungs 2 (two) times daily.    [provider]  doxycycline (VIBRAMYCIN) 100 MG capsule Take 1 capsule (100 mg total) by mouth 2 (two) times daily for 7 days. 06/25/20 07/02/20  Zigmund Gottron, NP  furosemide (LASIX) 40 MG tablet Take 40 mg by mouth daily.    [provider]    metoprolol (LOPRESSOR) 50 MG tablet Take 50 mg by mouth 2 (two) times daily.    [provider]  omeprazole (PRILOSEC) 20 MG capsule Take 20 mg by mouth daily.    [provider]  predniSONE (DELTASONE) 20 MG tablet 3-3-3-2-2-2-1-1-1 06/25/20   Augusto Gamble B, NP  tamsulosin (FLOMAX) 0.4 MG CAPS capsule Take 0.4 mg by mouth daily.    [provider]  Tiotropium Bromide Monohydrate (SPIRIVA RESPIMAT) 1.25 MCG/ACT AERS Inhale 2 puffs into the lungs daily.    [provider]    Family History Family History  Problem Relation Age of Onset  . CAD Mother   . Diabetes Sister   . Hypertension Sister     Social History Social History   Tobacco Use  . Smoking status: Never Smoker  . Smokeless tobacco: Never Used  Substance Use Topics  . Alcohol use: No  . Drug use: No     Allergies   Patient has no known allergies.   Review of Systems Review of Systems   Physical Exam Triage Vital Signs ED Triage Vitals  Enc Vitals Group     BP 06/25/20 1837 140/74     Pulse Rate 06/25/20 1837 72     Resp 06/25/20 1837 19     Temp 06/25/20 1837 98 F (36.7 C)     Temp Source 06/25/20 1837 Oral  SpO2 06/25/20 1837 100 %     Weight --      Height --      Head Circumference --      Peak Flow --      Pain Score 06/25/20 1836 4     Pain Loc --      Pain Edu? --      Excl. in [place]? --    No data found.  Updated Vital Signs BP 140/74 (BP Location: Right Arm)   Pulse 72   Temp 98 F (36.7 C) (Oral)   Resp 19   SpO2 100%   Visual Acuity Right Eye Distance:   Left Eye Distance:   Bilateral Distance:    Right Eye Near:   Left Eye Near:    Bilateral Near:     Physical Exam Constitutional:      Appearance: He is well-developed.  Cardiovascular:     Rate and Rhythm: Normal rate.  Pulmonary:     Effort: Pulmonary effort is normal.     Breath sounds: Wheezing present.     Comments: Home O2 in place with wheezing throughout  Skin:     General: Skin is warm and dry.  Neurological:     Mental Status: He is alert and oriented to person, place, and time.      UC Treatments / Results  Labs (all labs ordered are listed, but only abnormal results are displayed) Labs Reviewed - No data to display  EKG   Radiology DG Chest 2 View  Result Date: 06/25/2020 CLINICAL DATA:  Cough and shortness of breath. EXAM: CHEST - 2 VIEW COMPARISON:  12/14/2016 FINDINGS: Normal heart size. No pleural effusion or edema identified. The lungs are hyperinflated and there are diffuse coarsened interstitial markings compatible with advanced COPD. No superimposed airspace consolidation. IMPRESSION: 1. No acute cardiopulmonary abnormalities. 2. Advanced COPD. Electronically Signed   By: Kerby Moors M.D.   On: 06/25/2020 19:18    Procedures Procedures (including critical care time)  Medications Ordered in UC Medications - No data to display  Initial Impression / Assessment and Plan / UC Course  I have reviewed the triage vital signs and the nursing notes.  Pertinent labs & imaging results that were available during my care of the patient were reviewed by me and considered in my medical decision making (see chart for details).     cxr stable. Vitals stable. Congestion, cough, wheezing increase. COPD exacerbation treatment provided. Return precautions provided. Patient verbalized understanding and agreeable to plan.  Ambulatory out of clinic without difficulty.    Final Clinical Impressions(s) / UC Diagnoses   Final diagnoses:  Upper respiratory tract infection, unspecified type  COPD exacerbation ([place])     Discharge Instructions     Prednisone taper course as well as course of antibiotics.  Please return for any worsening of symptoms, if severe please go to the ER.  Xray looks stable tonight without evidence of pneumonia currently which is reassuring.    ED Prescriptions    Medication Sig Dispense Auth. Provider   predniSONE  (DELTASONE) 20 MG tablet 3-3-3-2-2-2-1-1-1 18 tablet Augusto Gamble B, NP   doxycycline (VIBRAMYCIN) 100 MG capsule Take 1 capsule (100 mg total) by mouth 2 (two) times daily for 7 days. 14 capsule Zigmund Gottron, NP     PDMP not reviewed this encounter.   Zigmund Gottron, NP 06/25/20 [phone]

## 2020-06-25 NOTE — ED Triage Notes (Signed)
Pt presents with non productive cough, shortness of breath, and wheezing X 4 days.  Pt has pending covid test from [place] on Monday.

## 2022-01-01 ENCOUNTER — Emergency Department (HOSPITAL_COMMUNITY): Payer: No Typology Code available for payment source

## 2022-01-01 ENCOUNTER — Other Ambulatory Visit: Payer: Self-pay

## 2022-01-01 ENCOUNTER — Encounter (HOSPITAL_COMMUNITY): Payer: Self-pay

## 2022-01-01 ENCOUNTER — Inpatient Hospital Stay (HOSPITAL_COMMUNITY)
Admission: EM | Admit: 2022-01-01 | Discharge: 2022-01-09 | DRG: 682 | Disposition: E | Payer: No Typology Code available for payment source | Attending: Student | Admitting: Student

## 2022-01-01 DIAGNOSIS — M1990 Unspecified osteoarthritis, unspecified site: Secondary | ICD-10-CM | POA: Diagnosis present

## 2022-01-01 DIAGNOSIS — E1122 Type 2 diabetes mellitus with diabetic chronic kidney disease: Secondary | ICD-10-CM | POA: Diagnosis present

## 2022-01-01 DIAGNOSIS — Z7982 Long term (current) use of aspirin: Secondary | ICD-10-CM

## 2022-01-01 DIAGNOSIS — I472 Ventricular tachycardia, unspecified: Secondary | ICD-10-CM | POA: Diagnosis not present

## 2022-01-01 DIAGNOSIS — N183 Chronic kidney disease, stage 3 unspecified: Secondary | ICD-10-CM | POA: Diagnosis present

## 2022-01-01 DIAGNOSIS — G928 Other toxic encephalopathy: Secondary | ICD-10-CM | POA: Diagnosis not present

## 2022-01-01 DIAGNOSIS — D61818 Other pancytopenia: Secondary | ICD-10-CM

## 2022-01-01 DIAGNOSIS — E785 Hyperlipidemia, unspecified: Secondary | ICD-10-CM | POA: Diagnosis present

## 2022-01-01 DIAGNOSIS — E860 Dehydration: Secondary | ICD-10-CM | POA: Diagnosis present

## 2022-01-01 DIAGNOSIS — C3432 Malignant neoplasm of lower lobe, left bronchus or lung: Secondary | ICD-10-CM | POA: Diagnosis present

## 2022-01-01 DIAGNOSIS — J439 Emphysema, unspecified: Secondary | ICD-10-CM | POA: Diagnosis present

## 2022-01-01 DIAGNOSIS — M549 Dorsalgia, unspecified: Secondary | ICD-10-CM | POA: Diagnosis present

## 2022-01-01 DIAGNOSIS — Z833 Family history of diabetes mellitus: Secondary | ICD-10-CM

## 2022-01-01 DIAGNOSIS — C3490 Malignant neoplasm of unspecified part of unspecified bronchus or lung: Secondary | ICD-10-CM | POA: Diagnosis present

## 2022-01-01 DIAGNOSIS — N179 Acute kidney failure, unspecified: Principal | ICD-10-CM | POA: Diagnosis present

## 2022-01-01 DIAGNOSIS — C799 Secondary malignant neoplasm of unspecified site: Principal | ICD-10-CM

## 2022-01-01 DIAGNOSIS — I10 Essential (primary) hypertension: Secondary | ICD-10-CM | POA: Diagnosis present

## 2022-01-01 DIAGNOSIS — T40605A Adverse effect of unspecified narcotics, initial encounter: Secondary | ICD-10-CM | POA: Diagnosis not present

## 2022-01-01 DIAGNOSIS — Z7189 Other specified counseling: Secondary | ICD-10-CM

## 2022-01-01 DIAGNOSIS — Z6823 Body mass index (BMI) 23.0-23.9, adult: Secondary | ICD-10-CM

## 2022-01-01 DIAGNOSIS — Z9981 Dependence on supplemental oxygen: Secondary | ICD-10-CM

## 2022-01-01 DIAGNOSIS — Z79899 Other long term (current) drug therapy: Secondary | ICD-10-CM

## 2022-01-01 DIAGNOSIS — J9621 Acute and chronic respiratory failure with hypoxia: Secondary | ICD-10-CM | POA: Diagnosis present

## 2022-01-01 DIAGNOSIS — I129 Hypertensive chronic kidney disease with stage 1 through stage 4 chronic kidney disease, or unspecified chronic kidney disease: Secondary | ICD-10-CM | POA: Diagnosis present

## 2022-01-01 DIAGNOSIS — I4729 Other ventricular tachycardia: Secondary | ICD-10-CM

## 2022-01-01 DIAGNOSIS — E440 Moderate protein-calorie malnutrition: Secondary | ICD-10-CM | POA: Insufficient documentation

## 2022-01-01 DIAGNOSIS — E1165 Type 2 diabetes mellitus with hyperglycemia: Secondary | ICD-10-CM | POA: Diagnosis present

## 2022-01-01 DIAGNOSIS — Z515 Encounter for palliative care: Secondary | ICD-10-CM

## 2022-01-01 DIAGNOSIS — Z8249 Family history of ischemic heart disease and other diseases of the circulatory system: Secondary | ICD-10-CM

## 2022-01-01 DIAGNOSIS — R1013 Epigastric pain: Secondary | ICD-10-CM | POA: Diagnosis present

## 2022-01-01 DIAGNOSIS — F4310 Post-traumatic stress disorder, unspecified: Secondary | ICD-10-CM | POA: Diagnosis present

## 2022-01-01 DIAGNOSIS — R627 Adult failure to thrive: Secondary | ICD-10-CM | POA: Diagnosis present

## 2022-01-01 DIAGNOSIS — G9340 Encephalopathy, unspecified: Secondary | ICD-10-CM

## 2022-01-01 DIAGNOSIS — E119 Type 2 diabetes mellitus without complications: Secondary | ICD-10-CM

## 2022-01-01 DIAGNOSIS — J441 Chronic obstructive pulmonary disease with (acute) exacerbation: Secondary | ICD-10-CM | POA: Diagnosis present

## 2022-01-01 DIAGNOSIS — Z66 Do not resuscitate: Secondary | ICD-10-CM | POA: Diagnosis not present

## 2022-01-01 DIAGNOSIS — K219 Gastro-esophageal reflux disease without esophagitis: Secondary | ICD-10-CM | POA: Diagnosis present

## 2022-01-01 DIAGNOSIS — N1832 Acute kidney failure, unspecified: Secondary | ICD-10-CM | POA: Diagnosis present

## 2022-01-01 DIAGNOSIS — G8929 Other chronic pain: Secondary | ICD-10-CM | POA: Diagnosis present

## 2022-01-01 DIAGNOSIS — Z7951 Long term (current) use of inhaled steroids: Secondary | ICD-10-CM

## 2022-01-01 DIAGNOSIS — R590 Localized enlarged lymph nodes: Secondary | ICD-10-CM | POA: Diagnosis present

## 2022-01-01 LAB — COMPREHENSIVE METABOLIC PANEL
ALT: 37 U/L (ref 0–44)
AST: 76 U/L — ABNORMAL HIGH (ref 15–41)
Albumin: 2.5 g/dL — ABNORMAL LOW (ref 3.5–5.0)
Alkaline Phosphatase: 115 U/L (ref 38–126)
Anion gap: 11 (ref 5–15)
BUN: 65 mg/dL — ABNORMAL HIGH (ref 8–23)
CO2: 28 mmol/L (ref 22–32)
Calcium: 9.4 mg/dL (ref 8.9–10.3)
Chloride: 95 mmol/L — ABNORMAL LOW (ref 98–111)
Creatinine, Ser: 2.17 mg/dL — ABNORMAL HIGH (ref 0.61–1.24)
GFR, Estimated: 32 mL/min — ABNORMAL LOW (ref >60)
Glucose, Bld: 304 mg/dL — ABNORMAL HIGH (ref 70–99)
Potassium: 3.5 mmol/L (ref 3.5–5.1)
Sodium: 134 mmol/L — ABNORMAL LOW (ref 135–145)
Total Bilirubin: 0.6 mg/dL (ref 0.3–1.2)
Total Protein: 6.3 g/dL — ABNORMAL LOW (ref 6.5–8.1)

## 2022-01-01 LAB — URINALYSIS, ROUTINE W REFLEX MICROSCOPIC
Bacteria, UA: NONE SEEN
Bilirubin Urine: NEGATIVE
Glucose, UA: >=500 mg/dL — AB
Ketones, ur: NEGATIVE mg/dL
Leukocytes,Ua: NEGATIVE
Nitrite: NEGATIVE
Protein, ur: >=300 mg/dL — AB
Specific Gravity, Urine: 1.018 (ref 1.005–1.030)
pH: 5 (ref 5.0–8.0)

## 2022-01-01 LAB — BLOOD GAS, VENOUS
Acid-Base Excess: 9.3 mmol/L — ABNORMAL HIGH (ref 0.0–2.0)
Bicarbonate: 33.5 mmol/L — ABNORMAL HIGH (ref 20.0–28.0)
O2 Saturation: 96.1 %
Patient temperature: 37
pCO2, Ven: 43 mmHg — ABNORMAL LOW (ref 44–60)
pH, Ven: 7.5 — ABNORMAL HIGH (ref 7.25–7.43)
pO2, Ven: 65 mmHg — ABNORMAL HIGH (ref 32–45)

## 2022-01-01 LAB — I-STAT CHEM 8, ED
BUN: 84 mg/dL — ABNORMAL HIGH (ref 8–23)
Calcium, Ion: 1.15 mmol/L (ref 1.15–1.40)
Chloride: 95 mmol/L — ABNORMAL LOW (ref 98–111)
Creatinine, Ser: 2.2 mg/dL — ABNORMAL HIGH (ref 0.61–1.24)
Glucose, Bld: 305 mg/dL — ABNORMAL HIGH (ref 70–99)
HCT: 37 % — ABNORMAL LOW (ref 39.0–52.0)
Hemoglobin: 12.6 g/dL — ABNORMAL LOW (ref 13.0–17.0)
Potassium: 5.6 mmol/L — ABNORMAL HIGH (ref 3.5–5.1)
Sodium: 133 mmol/L — ABNORMAL LOW (ref 135–145)
TCO2: 35 mmol/L — ABNORMAL HIGH (ref 22–32)

## 2022-01-01 LAB — CBC WITH DIFFERENTIAL/PLATELET
Abs Immature Granulocytes: 0 10*3/uL (ref 0.00–0.07)
Band Neutrophils: 4 %
Basophils Absolute: 0.2 10*3/uL — ABNORMAL HIGH (ref 0.0–0.1)
Basophils Relative: 3 %
Eosinophils Absolute: 0 10*3/uL (ref 0.0–0.5)
Eosinophils Relative: 0 %
HCT: 39.4 % (ref 39.0–52.0)
Hemoglobin: 13.5 g/dL (ref 13.0–17.0)
Lymphocytes Relative: 20 %
Lymphs Abs: 1.6 10*3/uL (ref 0.7–4.0)
MCH: 29.2 pg (ref 26.0–34.0)
MCHC: 34.3 g/dL (ref 30.0–36.0)
MCV: 85.1 fL (ref 80.0–100.0)
Monocytes Absolute: 0.1 10*3/uL (ref 0.1–1.0)
Monocytes Relative: 1 %
Neutro Abs: 6.2 10*3/uL (ref 1.7–7.7)
Neutrophils Relative %: 72 %
Platelets: 67 10*3/uL — ABNORMAL LOW (ref 150–400)
RBC: 4.63 MIL/uL (ref 4.22–5.81)
RDW: 14.5 % (ref 11.5–15.5)
Smear Review: DECREASED
WBC Morphology: REACTIVE
WBC: 8.2 10*3/uL (ref 4.0–10.5)
nRBC: 0.6 % — ABNORMAL HIGH (ref 0.0–0.2)
nRBC: 1 /100 WBC — ABNORMAL HIGH

## 2022-01-01 MED ORDER — SODIUM CHLORIDE 0.9 % IV BOLUS
500.0000 mL | Freq: Once | INTRAVENOUS | Status: AC
  Administered 2022-01-01: 500 mL via INTRAVENOUS

## 2022-01-01 MED ORDER — HYDRALAZINE HCL 20 MG/ML IJ SOLN
5.0000 mg | Freq: Once | INTRAMUSCULAR | Status: AC
  Administered 2022-01-01: 5 mg via INTRAVENOUS
  Filled 2022-01-01: qty 1

## 2022-01-01 MED ORDER — SODIUM CHLORIDE 0.9 % IV BOLUS
1000.0000 mL | Freq: Once | INTRAVENOUS | Status: AC
  Administered 2022-01-01: 1000 mL via INTRAVENOUS

## 2022-01-01 NOTE — ED Triage Notes (Signed)
Diagnosed with 4 different cancers and today complaining of feeling weak. Not eating x2 weeks, no urine output. Stage 5 renal no dialysis. 10 out of 10 pain in lower left quadrant. Was given 50 mcg of fentanyl by ems. ?

## 2022-01-01 NOTE — ED Provider Notes (Signed)
?[facility] DEPT ?Provider Note ? ? ?CSN: [id number] ?Arrival date & time: 01/02/2022  2040 ? ?  ? ?History ? ?Chief Complaint  ?Patient presents with  ? Weakness  ? ? ?Lucas Ortiz is a 71 y.o. male history of hypertension here presenting with worsening weakness.  About 2 weeks ago, patient was told that he has some kind of cancer.  He had a PET scan done and there was concern for possible metastatic lung cancer versus lymphoma.  Patient had a lung biopsy done at [facility] last week.  He then met up with his oncologist at the [facility] this past week.  Patient was told that he will need admission for IV fluids as his kidney function is getting worse and he will need to be started on chemotherapy in the hospital.  However patient refused admission at that time.  Per the daughter, patient is getting progressively weaker and now cannot even get up.  He has persistent poor appetite and will only eat cold foods.  Patient has no vomiting.  EMS was called because of weakness and patient was brought here for further evaluation.  Patient on 5 L nasal cannula at baseline.  ? ?The history is provided by the patient and a relative.  ? ?  ? ?Home Medications ?Prior to Admission medications   ?Medication Sig Start Date End Date Taking? Authorizing Provider  ?amLODipine (NORVASC) 10 MG tablet Take 10 mg by mouth daily.     [provider]  ?aspirin EC 81 MG tablet Take 81 mg by mouth daily.    [provider]  ?Budesonide-Formoterol Fumarate (SYMBICORT IN) Inhale 2 puffs into the lungs 2 (two) times daily.    [provider]  ?furosemide (LASIX) 40 MG tablet Take 40 mg by mouth daily.    [provider]  ?metoprolol (LOPRESSOR) 50 MG tablet Take 50 mg by mouth 2 (two) times daily.    [provider]  ?omeprazole (PRILOSEC) 20 MG capsule Take 20 mg by mouth daily.    [provider]  ?predniSONE (DELTASONE) 20 MG tablet 3-3-3-2-2-2-1-1-1 06/25/20    Augusto Gamble B, NP  ?tamsulosin (FLOMAX) 0.4 MG CAPS capsule Take 0.4 mg by mouth daily.    [provider]  ?Tiotropium Bromide Monohydrate (SPIRIVA RESPIMAT) 1.25 MCG/ACT AERS Inhale 2 puffs into the lungs daily.    [provider]  ?   ? ?Allergies    ?Patient has no known allergies.   ? ?Review of Systems   ?Review of Systems  ?Neurological:  Positive for weakness.  ?All other systems reviewed and are negative. ? ?Physical Exam ?Updated Vital Signs ?BP (!) 198/92   Pulse 95   Temp 98.3 ?F (36.8 ?C) (Oral)   Resp 20   Ht _0  (1.778 m)   Wt 74.8 kg   SpO2 99%   BMI 23.68 kg/m?  ?Physical Exam ?Vitals and nursing note reviewed.  ?Constitutional:   ?   Comments: Chronically ill-appearing, patient is very weak and unable to even sit up  ?HENT:  ?   Head: Normocephalic.  ?   Nose: Nose normal.  ?   Mouth/Throat:  ?   Mouth: Mucous membranes are moist.  ?Eyes:  ?   Extraocular Movements: Extraocular movements intact.  ?   Pupils: Pupils are equal, round, and reactive to light.  ?Cardiovascular:  ?   Rate and Rhythm: Normal rate and regular rhythm.  ?   Pulses: Normal pulses.  ?  Heart sounds: Normal heart sounds.  ?Pulmonary:  ?   Comments: Tachypneic, diminished bilateral lung bases ?Abdominal:  ?   Comments: Distended and mild diffuse tenderness  ?Musculoskeletal:     ?   General: Normal range of motion.  ?   Cervical back: Normal range of motion and neck supple.  ?   Comments: Trace pitting edema bilaterally  ?Skin: ?   General: Skin is warm.  ?   Capillary Refill: Capillary refill takes less than 2 seconds.  ?Neurological:  ?   General: No focal deficit present.  ?   Mental Status: He is alert and oriented to person, place, and time.  ?Psychiatric:     ?   Mood and Affect: Mood normal.     ?   Behavior: Behavior normal.  ? ? ?ED Results / Procedures / Treatments   ?Labs ?(all labs ordered are listed, but only abnormal results are displayed) ?Labs Reviewed  ?CBC WITH  DIFFERENTIAL/PLATELET - Abnormal; Notable for the following components:  ?    Result Value  ? Platelets 67 (*)   ? nRBC 0.6 (*)   ? Basophils Absolute 0.2 (*)   ? nRBC 1 (*)   ? All other components within normal limits  ?COMPREHENSIVE METABOLIC PANEL - Abnormal; Notable for the following components:  ? Sodium 134 (*)   ? Chloride 95 (*)   ? Glucose, Bld 304 (*)   ? BUN 65 (*)   ? Creatinine, Ser 2.17 (*)   ? Total Protein 6.3 (*)   ? Albumin 2.5 (*)   ? AST 76 (*)   ? GFR, Estimated 32 (*)   ? All other components within normal limits  ?BLOOD GAS, VENOUS - Abnormal; Notable for the following components:  ? pH, Ven 7.5 (*)   ? pCO2, Ven 43 (*)   ? pO2, Ven 65 (*)   ? Bicarbonate 33.5 (*)   ? Acid-Base Excess 9.3 (*)   ? All other components within normal limits  ?URINALYSIS, ROUTINE W REFLEX MICROSCOPIC - Abnormal; Notable for the following components:  ? Glucose, UA >=500 (*)   ? Hgb urine dipstick SMALL (*)   ? Protein, ur >=300 (*)   ? All other components within normal limits  ?I-STAT CHEM 8, ED - Abnormal; Notable for the following components:  ? Sodium 133 (*)   ? Potassium 5.6 (*)   ? Chloride 95 (*)   ? BUN 84 (*)   ? Creatinine, Ser 2.20 (*)   ? Glucose, Bld 305 (*)   ? TCO2 35 (*)   ? Hemoglobin 12.6 (*)   ? HCT 37.0 (*)   ? All other components within normal limits  ? ? ?EKG ?EKG Interpretation ? ?Date/Time:  Friday January 01 2022 21:21:42 EDT ?Ventricular Rate:  106 ?PR Interval:  128 ?QRS Duration: 85 ?QT Interval:  345 ?QTC Calculation: 459 ?R Axis:   86 ?Text Interpretation: Sinus tachycardia with irregular rate Probable left atrial enlargement Borderline right axis deviation Repol abnrm suggests ischemia, inferior leads rate faster since previous Confirmed by Wandra Arthurs [phone]) on 12/26/2021 9:32:00 PM ? ?Radiology ?DG Chest Port 1 View ? ?Result Date: 01/05/2022 ?CLINICAL DATA:  Shortness of breath. EXAM: PORTABLE CHEST 1 VIEW COMPARISON:  Chest plain film, dated June 25, 2020 and chest CT,  dated December 10, 2018 30 FINDINGS: Marked severity emphysematous lung disease is seen involving the bilateral upper lobes. Lobulated soft tissue masses are seen along the left lung base  and infrahilar region on the left. There is no evidence of a pleural effusion or pneumothorax. The heart size and mediastinal contours are within normal limits. The visualized skeletal structures are unremarkable. IMPRESSION: 1. Marked severity bilateral upper lobe emphysematous lung disease. 2. Lobulated soft tissue masses along the left lung base and left infrahilar region, consistent with known malignancy. Electronically Signed   By: Virgina Norfolk M.D.   On: 12/22/2021 21:45  ? ?CT Renal Stone Study ? ?Result Date: 12/09/2021 ?CLINICAL DATA:  Patient reports history of multiple malignancies not otherwise specified, weakness, anorexia, left lower quadrant pain EXAM: CT ABDOMEN AND PELVIS WITHOUT CONTRAST TECHNIQUE: Multidetector CT imaging of the abdomen and pelvis was performed following the standard protocol without IV contrast. RADIATION DOSE REDUCTION: This exam was performed according to the departmental dose-optimization program which includes automated exposure control, adjustment of the mA and/or kV according to patient size and/or use of iterative reconstruction technique. COMPARISON:  12/09/2021 FINDINGS: Lower chest: Multiple pleural and parenchymal masses are seen within the left lower lobe, largest measuring 5.4 x 4.2 cm reference image 8/2, consistent with known history of malignancy. Little change since prior CT exam. Trace left pleural effusion. Hepatobiliary: Unremarkable unenhanced appearance of the liver and gallbladder. Pancreas: Unremarkable unenhanced appearance. Spleen: Unremarkable unenhanced appearance. Adrenals/Urinary Tract: The adrenals are unremarkable. No urinary tract calculi or obstructive uropathy within either kidney. The bladder is moderately distended without focal abnormality. Stomach/Bowel: No  bowel obstruction or ileus. Normal appendix right lower quadrant. No bowel wall thickening or inflammatory change. Vascular/Lymphatic: Extensive lymphadenopathy throughout the abdomen and pelvis consistent with metastatic disea

## 2022-01-02 DIAGNOSIS — Z66 Do not resuscitate: Secondary | ICD-10-CM | POA: Diagnosis not present

## 2022-01-02 DIAGNOSIS — N179 Acute kidney failure, unspecified: Secondary | ICD-10-CM

## 2022-01-02 DIAGNOSIS — J9601 Acute respiratory failure with hypoxia: Secondary | ICD-10-CM | POA: Diagnosis not present

## 2022-01-02 DIAGNOSIS — J441 Chronic obstructive pulmonary disease with (acute) exacerbation: Secondary | ICD-10-CM | POA: Diagnosis not present

## 2022-01-02 DIAGNOSIS — I129 Hypertensive chronic kidney disease with stage 1 through stage 4 chronic kidney disease, or unspecified chronic kidney disease: Secondary | ICD-10-CM | POA: Diagnosis present

## 2022-01-02 DIAGNOSIS — E440 Moderate protein-calorie malnutrition: Secondary | ICD-10-CM | POA: Diagnosis present

## 2022-01-02 DIAGNOSIS — C3490 Malignant neoplasm of unspecified part of unspecified bronchus or lung: Secondary | ICD-10-CM | POA: Diagnosis present

## 2022-01-02 DIAGNOSIS — M549 Dorsalgia, unspecified: Secondary | ICD-10-CM | POA: Diagnosis present

## 2022-01-02 DIAGNOSIS — G9340 Encephalopathy, unspecified: Secondary | ICD-10-CM | POA: Diagnosis not present

## 2022-01-02 DIAGNOSIS — G928 Other toxic encephalopathy: Secondary | ICD-10-CM | POA: Diagnosis not present

## 2022-01-02 DIAGNOSIS — N1832 Chronic kidney disease, stage 3b: Secondary | ICD-10-CM | POA: Diagnosis present

## 2022-01-02 DIAGNOSIS — Z7951 Long term (current) use of inhaled steroids: Secondary | ICD-10-CM | POA: Diagnosis not present

## 2022-01-02 DIAGNOSIS — F4310 Post-traumatic stress disorder, unspecified: Secondary | ICD-10-CM | POA: Diagnosis present

## 2022-01-02 DIAGNOSIS — R590 Localized enlarged lymph nodes: Secondary | ICD-10-CM | POA: Diagnosis present

## 2022-01-02 DIAGNOSIS — R1013 Epigastric pain: Secondary | ICD-10-CM | POA: Diagnosis present

## 2022-01-02 DIAGNOSIS — D61818 Other pancytopenia: Secondary | ICD-10-CM | POA: Diagnosis present

## 2022-01-02 DIAGNOSIS — J439 Emphysema, unspecified: Secondary | ICD-10-CM | POA: Diagnosis present

## 2022-01-02 DIAGNOSIS — Z515 Encounter for palliative care: Secondary | ICD-10-CM | POA: Diagnosis not present

## 2022-01-02 DIAGNOSIS — E860 Dehydration: Secondary | ICD-10-CM | POA: Diagnosis present

## 2022-01-02 DIAGNOSIS — M1990 Unspecified osteoarthritis, unspecified site: Secondary | ICD-10-CM | POA: Diagnosis present

## 2022-01-02 DIAGNOSIS — R627 Adult failure to thrive: Secondary | ICD-10-CM | POA: Diagnosis present

## 2022-01-02 DIAGNOSIS — K219 Gastro-esophageal reflux disease without esophagitis: Secondary | ICD-10-CM | POA: Diagnosis present

## 2022-01-02 DIAGNOSIS — I472 Ventricular tachycardia, unspecified: Secondary | ICD-10-CM | POA: Diagnosis not present

## 2022-01-02 DIAGNOSIS — E1165 Type 2 diabetes mellitus with hyperglycemia: Secondary | ICD-10-CM | POA: Diagnosis present

## 2022-01-02 DIAGNOSIS — E119 Type 2 diabetes mellitus without complications: Secondary | ICD-10-CM

## 2022-01-02 DIAGNOSIS — G8929 Other chronic pain: Secondary | ICD-10-CM | POA: Diagnosis present

## 2022-01-02 DIAGNOSIS — M545 Low back pain, unspecified: Secondary | ICD-10-CM | POA: Diagnosis not present

## 2022-01-02 DIAGNOSIS — J9621 Acute and chronic respiratory failure with hypoxia: Secondary | ICD-10-CM | POA: Diagnosis not present

## 2022-01-02 DIAGNOSIS — E785 Hyperlipidemia, unspecified: Secondary | ICD-10-CM | POA: Diagnosis present

## 2022-01-02 DIAGNOSIS — E1122 Type 2 diabetes mellitus with diabetic chronic kidney disease: Secondary | ICD-10-CM | POA: Diagnosis present

## 2022-01-02 DIAGNOSIS — C3432 Malignant neoplasm of lower lobe, left bronchus or lung: Secondary | ICD-10-CM | POA: Diagnosis present

## 2022-01-02 LAB — CBC WITH DIFFERENTIAL/PLATELET
Abs Immature Granulocytes: 0.6 10*3/uL — ABNORMAL HIGH (ref 0.00–0.07)
Basophils Absolute: 0.1 10*3/uL (ref 0.0–0.1)
Basophils Relative: 1 %
Eosinophils Absolute: 0.2 10*3/uL (ref 0.0–0.5)
Eosinophils Relative: 2 %
HCT: 41.6 % (ref 39.0–52.0)
Hemoglobin: 13.7 g/dL (ref 13.0–17.0)
Immature Granulocytes: 6 %
Lymphocytes Relative: 20 %
Lymphs Abs: 1.9 10*3/uL (ref 0.7–4.0)
MCH: 29.7 pg (ref 26.0–34.0)
MCHC: 32.9 g/dL (ref 30.0–36.0)
MCV: 90 fL (ref 80.0–100.0)
Monocytes Absolute: 0.5 10*3/uL (ref 0.1–1.0)
Monocytes Relative: 5 %
Neutro Abs: 6.3 10*3/uL (ref 1.7–7.7)
Neutrophils Relative %: 66 %
Platelets: 63 10*3/uL — ABNORMAL LOW (ref 150–400)
RBC: 4.62 MIL/uL (ref 4.22–5.81)
RDW: 14.8 % (ref 11.5–15.5)
WBC: 9.6 10*3/uL (ref 4.0–10.5)
nRBC: 0.9 % — ABNORMAL HIGH (ref 0.0–0.2)

## 2022-01-02 LAB — RENAL FUNCTION PANEL
Albumin: 2.6 g/dL — ABNORMAL LOW (ref 3.5–5.0)
Anion gap: 9 (ref 5–15)
BUN: 51 mg/dL — ABNORMAL HIGH (ref 8–23)
CO2: 28 mmol/L (ref 22–32)
Calcium: 9.2 mg/dL (ref 8.9–10.3)
Chloride: 100 mmol/L (ref 98–111)
Creatinine, Ser: 1.78 mg/dL — ABNORMAL HIGH (ref 0.61–1.24)
GFR, Estimated: 40 mL/min — ABNORMAL LOW (ref >60)
Glucose, Bld: 135 mg/dL — ABNORMAL HIGH (ref 70–99)
Phosphorus: 3.9 mg/dL (ref 2.5–4.6)
Potassium: 4.1 mmol/L (ref 3.5–5.1)
Sodium: 137 mmol/L (ref 135–145)

## 2022-01-02 LAB — HEMOGLOBIN A1C
Hgb A1c MFr Bld: 8.6 % — ABNORMAL HIGH (ref 4.8–5.6)
Mean Plasma Glucose: 200.12 mg/dL

## 2022-01-02 LAB — GLUCOSE, CAPILLARY
Glucose-Capillary: 217 mg/dL — ABNORMAL HIGH (ref 70–99)
Glucose-Capillary: 143 mg/dL — ABNORMAL HIGH (ref 70–99)
Glucose-Capillary: 117 mg/dL — ABNORMAL HIGH (ref 70–99)
Glucose-Capillary: 163 mg/dL — ABNORMAL HIGH (ref 70–99)

## 2022-01-02 LAB — MAGNESIUM: Magnesium: 2.2 mg/dL (ref 1.7–2.4)

## 2022-01-02 MED ORDER — OXYCODONE HCL ER 20 MG PO T12A
20.0000 mg | EXTENDED_RELEASE_TABLET | Freq: Two times a day (BID) | ORAL | Status: DC
  Administered 2022-01-02 (×2): 20 mg via ORAL
  Filled 2022-01-02 (×2): qty 1

## 2022-01-02 MED ORDER — HYDRALAZINE HCL 25 MG PO TABS
25.0000 mg | ORAL_TABLET | ORAL | Status: DC | PRN
  Administered 2022-01-02 (×2): 25 mg via ORAL
  Filled 2022-01-02 (×2): qty 1

## 2022-01-02 MED ORDER — PANTOPRAZOLE SODIUM 40 MG PO TBEC
40.0000 mg | DELAYED_RELEASE_TABLET | Freq: Every day | ORAL | Status: DC
  Administered 2022-01-02 – 2022-01-04 (×3): 40 mg via ORAL
  Filled 2022-01-02 (×4): qty 1

## 2022-01-02 MED ORDER — TAMSULOSIN HCL 0.4 MG PO CAPS
0.4000 mg | ORAL_CAPSULE | Freq: Every day | ORAL | Status: DC
Start: 2022-01-02 — End: 2022-01-02

## 2022-01-02 MED ORDER — QUETIAPINE FUMARATE 100 MG PO TABS
100.0000 mg | ORAL_TABLET | Freq: Every day | ORAL | Status: DC
  Administered 2022-01-02 – 2022-01-03 (×2): 100 mg via ORAL
  Filled 2022-01-02 (×2): qty 1

## 2022-01-02 MED ORDER — AMLODIPINE BESYLATE 10 MG PO TABS
10.0000 mg | ORAL_TABLET | Freq: Every day | ORAL | Status: DC
  Administered 2022-01-02 – 2022-01-04 (×3): 10 mg via ORAL
  Filled 2022-01-02 (×4): qty 2

## 2022-01-02 MED ORDER — OXYCODONE HCL 5 MG PO TABS
5.0000 mg | ORAL_TABLET | ORAL | Status: DC | PRN
  Administered 2022-01-02: 5 mg via ORAL
  Filled 2022-01-02: qty 1

## 2022-01-02 MED ORDER — ENOXAPARIN SODIUM 30 MG/0.3ML IJ SOSY
30.0000 mg | PREFILLED_SYRINGE | INTRAMUSCULAR | Status: DC
  Administered 2022-01-02 – 2022-01-03 (×2): 30 mg via SUBCUTANEOUS
  Filled 2022-01-02 (×2): qty 0.3

## 2022-01-02 MED ORDER — SENNA 8.6 MG PO TABS
1.0000 | ORAL_TABLET | Freq: Two times a day (BID) | ORAL | Status: DC
  Administered 2022-01-02 – 2022-01-04 (×6): 8.6 mg via ORAL
  Filled 2022-01-02 (×7): qty 1

## 2022-01-02 MED ORDER — BUDESONIDE-FORMOTEROL FUMARATE 160-4.5 MCG/ACT IN AERO
2.0000 | INHALATION_SPRAY | Freq: Two times a day (BID) | RESPIRATORY_TRACT | Status: DC
Start: 2022-01-02 — End: 2022-01-05
  Administered 2022-01-02 – 2022-01-05 (×6): 2 via RESPIRATORY_TRACT
  Filled 2022-01-02 (×2): qty 6

## 2022-01-02 MED ORDER — DULOXETINE HCL 30 MG PO CPEP
60.0000 mg | ORAL_CAPSULE | Freq: Every day | ORAL | Status: DC
  Administered 2022-01-02 – 2022-01-04 (×3): 60 mg via ORAL
  Filled 2022-01-02 (×3): qty 2

## 2022-01-02 MED ORDER — METOPROLOL TARTRATE 25 MG PO TABS
50.0000 mg | ORAL_TABLET | Freq: Two times a day (BID) | ORAL | Status: DC
  Administered 2022-01-02 – 2022-01-04 (×6): 50 mg via ORAL
  Filled 2022-01-02 (×7): qty 2

## 2022-01-02 MED ORDER — LABETALOL HCL 5 MG/ML IV SOLN
10.0000 mg | INTRAVENOUS | Status: DC | PRN
  Administered 2022-01-02 (×2): 10 mg via INTRAVENOUS
  Filled 2022-01-02 (×4): qty 4

## 2022-01-02 MED ORDER — ASPIRIN EC 81 MG PO TBEC: 81.0000 mg | DELAYED_RELEASE_TABLET | Freq: Every day | ORAL | Status: DC

## 2022-01-02 MED ORDER — INSULIN ASPART 100 UNIT/ML IJ SOLN
0.0000 [IU] | Freq: Three times a day (TID) | INTRAMUSCULAR | Status: DC
  Administered 2022-01-02: 2 [IU] via SUBCUTANEOUS
  Administered 2022-01-02: 5 [IU] via SUBCUTANEOUS
  Administered 2022-01-03: 2 [IU] via SUBCUTANEOUS
  Administered 2022-01-03: 3 [IU] via SUBCUTANEOUS
  Administered 2022-01-04: 8 [IU] via SUBCUTANEOUS
  Administered 2022-01-04 (×2): 3 [IU] via SUBCUTANEOUS
  Administered 2022-01-05: 2 [IU] via SUBCUTANEOUS
  Filled 2022-01-02: qty 0.15

## 2022-01-02 MED ORDER — TAMSULOSIN HCL 0.4 MG PO CAPS
0.4000 mg | ORAL_CAPSULE | Freq: Every day | ORAL | Status: DC
  Administered 2022-01-02 – 2022-01-04 (×3): 0.4 mg via ORAL
  Filled 2022-01-02 (×3): qty 1

## 2022-01-02 MED ORDER — HYDROMORPHONE HCL 1 MG/ML IJ SOLN
1.0000 mg | Freq: Once | INTRAMUSCULAR | Status: AC
  Administered 2022-01-02: 1 mg via INTRAVENOUS
  Filled 2022-01-02: qty 1

## 2022-01-02 MED ORDER — TIOTROPIUM BROMIDE MONOHYDRATE 1.25 MCG/ACT IN AERS: 2.0000 | INHALATION_SPRAY | Freq: Every day | RESPIRATORY_TRACT | Status: DC

## 2022-01-02 MED ORDER — UMECLIDINIUM BROMIDE 62.5 MCG/ACT IN AEPB
1.0000 | INHALATION_SPRAY | Freq: Every day | RESPIRATORY_TRACT | Status: DC
  Administered 2022-01-03 – 2022-01-05 (×3): 1 via RESPIRATORY_TRACT
  Filled 2022-01-02: qty 7

## 2022-01-02 MED ORDER — FUROSEMIDE 40 MG PO TABS
40.0000 mg | ORAL_TABLET | Freq: Every day | ORAL | Status: DC
  Administered 2022-01-02: 40 mg via ORAL
  Filled 2022-01-02: qty 1

## 2022-01-02 MED ORDER — ASPIRIN EC 81 MG PO TBEC
81.0000 mg | DELAYED_RELEASE_TABLET | Freq: Every day | ORAL | Status: DC
  Administered 2022-01-02 – 2022-01-04 (×3): 81 mg via ORAL
  Filled 2022-01-02 (×3): qty 1

## 2022-01-02 MED ORDER — SODIUM CHLORIDE 0.45 % IV SOLN: INTRAVENOUS | Status: DC

## 2022-01-02 MED ORDER — TORSEMIDE 20 MG PO TABS
20.0000 mg | ORAL_TABLET | Freq: Every day | ORAL | Status: DC
Start: 2022-01-02 — End: 2022-01-03
  Administered 2022-01-02: 20 mg via ORAL
  Filled 2022-01-02 (×2): qty 1

## 2022-01-02 MED ORDER — ATORVASTATIN CALCIUM 10 MG PO TABS
20.0000 mg | ORAL_TABLET | Freq: Every day | ORAL | Status: DC
  Administered 2022-01-02 – 2022-01-04 (×3): 20 mg via ORAL
  Filled 2022-01-02 (×4): qty 2

## 2022-01-02 MED ORDER — SODIUM CHLORIDE 0.9 % IV SOLN: Freq: Once | INTRAVENOUS | Status: AC

## 2022-01-02 NOTE — H&P (Signed)
?History and Physical  ? ? ?Lucas Ortiz DVV:[id number] DOB: 12/29/1950 DOA: 12/22/2021 ? ?DOS: the patient was seen and examined on 12/15/2021 ? ?PCP: System, Provider Not In  ? ?Patient coming from: Home ? ?I have personally briefly reviewed patient's old medical records in [facility] and reviewed [place] records ? ?Patient with multiple medical problems including DM, HTN, COPD/emphysema, PTSD who was recently diagnosed with probable lung cancer - bx results pending. He is [facility] at [facility] and was to be admitted to [facility] for workup and treatment. For two weeks he has been progressively weaker, poor calorie intake. He ran out of pain medications and was in considerable pain. Due to his weakness and discomfort his daughter did not feel she could get him to [facility] ED and brought him to [facility] ED for relief of pain and treatment of weakness.   ? ?ED Course: T 98.3  194/97  99  16. Lab Na 133, K 5.6  Cr 2.2  Glucose 305  Hgb 12.6  CXR emphysema and lung masses. CT A/P lung masses pleural and parenchymal at lung bases, adenopathy. IN ED he was given 1 L NS, hydralazine for elevated BP. TRH called to admit for continued management of pain, dehydration and DM. ? ?Review of Systems:  ?Review of Systems  ?Constitutional:  Positive for malaise/fatigue and weight loss. Negative for fever.  ?HENT: Negative.    ?Eyes: Negative.   ?Respiratory:  Positive for shortness of breath. Negative for cough and sputum production.   ?Cardiovascular:  Negative for chest pain and palpitations.  ?Gastrointestinal:  Positive for heartburn. Negative for diarrhea.  ?Genitourinary: Negative.   ?Musculoskeletal:  Positive for back pain and myalgias.  ?Skin: Negative.   ?Neurological: Negative.   ?Endo/Heme/Allergies: Negative.   ?Psychiatric/Behavioral:  The patient is nervous/anxious.   ? ?Past Medical History:  ?Diagnosis Date  ? COPD (chronic obstructive pulmonary disease) ([place])   ? Hyperlipidemia   ? Hypertension   ? ? ?Soc Hx - widowed. Served  in [employer] 2 years - [place]. Was a Engineer, drilling. He has 1 daughter.  ? ?History reviewed. No pertinent surgical history. ? ? reports that he has never smoked. He has never used smokeless tobacco. He reports that he does not drink alcohol and does not use drugs. ? ?No Known Allergies ? ?Family History  ?Problem Relation Age of Onset  ? CAD Mother   ? Diabetes Sister   ? Hypertension Sister   ? ? ?Prior to Admission medications   ?Medication Sig Start Date End Date Taking? Authorizing Provider  ?amLODipine (NORVASC) 10 MG tablet Take 10 mg by mouth daily.     [provider]  ?aspirin EC 81 MG tablet Take 81 mg by mouth daily.    [provider]  ?Budesonide-Formoterol Fumarate (SYMBICORT IN) Inhale 2 puffs into the lungs 2 (two) times daily.    [provider]  ?furosemide (LASIX) 40 MG tablet Take 40 mg by mouth daily.    [provider]  ?metoprolol (LOPRESSOR) 50 MG tablet Take 50 mg by mouth 2 (two) times daily.    [provider]  ?omeprazole (PRILOSEC) 20 MG capsule Take 20 mg by mouth daily.    [provider]  ?predniSONE (DELTASONE) 20 MG tablet 3-3-3-2-2-2-1-1-1 06/25/20   Augusto Gamble B, NP  ?tamsulosin (FLOMAX) 0.4 MG CAPS capsule Take 0.4 mg by mouth daily.    [provider]  ?Tiotropium Bromide Monohydrate (SPIRIVA RESPIMAT) 1.25 MCG/ACT AERS Inhale  2 puffs into the lungs daily.    [provider]  ? ? ?Physical Exam: ?Vitals:  ? 01/02/22 0000 01/02/22 0030 01/02/22 0100 01/02/22 0130  ?BP: (!) 187/134 (!) 198/98 (!) 203/96 (!) 194/97  ?Pulse: (!) 102 (!) 102 (!) 101 99  ?Resp: 20 (!) 23 17 16   ?Temp:      ?TempSrc:      ?SpO2: 99% 100% 99% 98%  ?Weight:      ?Height:      ? ? ?Physical Exam ?Constitutional:   ?   General: He is awake.  ?   Appearance: He is underweight. He is ill-appearing.  ?HENT:  ?   Head: Normocephalic and atraumatic.  ?   Nose: Nose normal.  ?   Mouth/Throat:  ?   Mouth: Mucous membranes are dry.   ?Eyes:  ?   Extraocular Movements: Extraocular movements intact.  ?   Pupils: Pupils are equal, round, and reactive to light.  ?Cardiovascular:  ?   Rate and Rhythm: Normal rate and regular rhythm.  ?Pulmonary:  ?   Effort: Pulmonary effort is normal.  ?   Breath sounds: Normal breath sounds.  ?Abdominal:  ?   General: Bowel sounds are normal.  ?   Palpations: Abdomen is soft.  ?Musculoskeletal:     ?   General: Normal range of motion.  ?   Cervical back: Normal range of motion and neck supple.  ?Skin: ?   General: Skin is warm and dry.  ?Neurological:  ?   General: No focal deficit present.  ?Psychiatric:     ?   Mood and Affect: Mood is depressed. Affect is blunt.     ?   Speech: Speech normal.     ?   Behavior: Behavior is cooperative.  ?  ? ?Labs on Admission: I have personally reviewed following labs and imaging studies ? ?CBC: ?Recent Labs  ?Lab 12/28/2021 ?2120 12/31/2021 ?2133  ?WBC 8.2  --   ?NEUTROABS 6.2  --   ?HGB 13.5 12.6*  ?HCT 39.4 37.0*  ?MCV 85.1  --   ?PLT 67*  --   ? ?Basic Metabolic Panel: ?Recent Labs  ?Lab 12/12/2021 ?2120 01/08/2022 ?2133  ?NA 134* 133*  ?K 3.5 5.6*  ?CL 95* 95*  ?CO2 28  --   ?GLUCOSE 304* 305*  ?BUN 65* 84*  ?CREATININE 2.17* 2.20*  ?CALCIUM 9.4  --   ? ?GFR: ?Estimated Creatinine Clearance: 31.3 mL/min (A) (by C-G formula based on SCr of 2.2 mg/dL (H)). ?Liver Function Tests: ?Recent Labs  ?Lab 12/12/2021 ?2120  ?AST 76*  ?ALT 37  ?ALKPHOS 115  ?BILITOT 0.6  ?PROT 6.3*  ?ALBUMIN 2.5*  ? ?No results for input(s): LIPASE, AMYLASE in the last 168 hours. ?No results for input(s): AMMONIA in the last 168 hours. ?Coagulation Profile: ?No results for input(s): INR, PROTIME in the last 168 hours. ?Cardiac Enzymes: ?No results for input(s): CKTOTAL, CKMB, CKMBINDEX, TROPONINI in the last 168 hours. ?BNP (last 3 results) ?No results for input(s): PROBNP in the last 8760 hours. ?HbA1C: ?No results for input(s): HGBA1C in the last 72 hours. ?CBG: ?No results for input(s): GLUCAP in the last  168 hours. ?Lipid Profile: ?No results for input(s): CHOL, HDL, LDLCALC, TRIG, CHOLHDL, LDLDIRECT in the last 72 hours. ?Thyroid Function Tests: ?No results for input(s): TSH, T4TOTAL, FREET4, T3FREE, THYROIDAB in the last 72 hours. ?Anemia Panel: ?No results for input(s): VITAMINB12, FOLATE, FERRITIN, TIBC, IRON, RETICCTPCT in the last 72 hours. ?Urine  analysis: ?   ?Component Value Date/Time  ? COLORURINE YELLOW 12/31/2021 2125  ? APPEARANCEUR CLEAR 12/12/2021 2125  ? LABSPEC 1.018 01/04/2022 2125  ? PHURINE 5.0 12/18/2021 2125  ? GLUCOSEU >=500 (A) 12/26/2021 2125  ? HGBUR SMALL (A) 12/14/2021 2125  ? [place] NEGATIVE 01/06/2022 2125  ? [place] NEGATIVE 01/08/2022 2125  ? PROTEINUR >=300 (A) 12/23/2021 2125  ? NITRITE NEGATIVE 12/13/2021 2125  ? LEUKOCYTESUR NEGATIVE 12/15/2021 2125  ? ? ?Radiological Exams on Admission: I have personally reviewed images ?DG Chest Port 1 View ? ?Result Date: 12/26/2021 ?CLINICAL DATA:  Shortness of breath. EXAM: PORTABLE CHEST 1 VIEW COMPARISON:  Chest plain film, dated June 25, 2020 and chest CT, dated December 10, 2018 30 FINDINGS: Marked severity emphysematous lung disease is seen involving the bilateral upper lobes. Lobulated soft tissue masses are seen along the left lung base and infrahilar region on the left. There is no evidence of a pleural effusion or pneumothorax. The heart size and mediastinal contours are within normal limits. The visualized skeletal structures are unremarkable. IMPRESSION: 1. Marked severity bilateral upper lobe emphysematous lung disease. 2. Lobulated soft tissue masses along the left lung base and left infrahilar region, consistent with known malignancy. Electronically Signed   By: Virgina Norfolk M.D.   On: 01/08/2022 21:45  ? ?CT Renal Stone Study ? ?Result Date: 12/09/2021 ?CLINICAL DATA:  Patient reports history of multiple malignancies not otherwise specified, weakness, anorexia, left lower quadrant pain EXAM: CT ABDOMEN AND PELVIS  WITHOUT CONTRAST TECHNIQUE: Multidetector CT imaging of the abdomen and pelvis was performed following the standard protocol without IV contrast. RADIATION DOSE REDUCTION: This exam was performed according t

## 2022-01-02 NOTE — Subjective & Objective (Signed)
Patient with multiple medical problems including DM, HTN, COPD/emphysema, PTSD who was recently diagnosed with probable lung cancer - bx results pending. He is followed at [facility] and was to be admitted to [facility] for workup and treatment. For two weeks he has been progressively weaker, poor calorie intake. He ran out of pain medications and was in considerable pain. Due to his weakness and discomfort his daughter did not feel she could get him to [facility] ED and brought him to [facility] ED for relief of pain and treatment of weakness.  ?

## 2022-01-02 NOTE — Assessment & Plan Note (Addendum)
Does not seem to be on medication for this at home. ?-Continue p.o. Protonix for now ?

## 2022-01-02 NOTE — Assessment & Plan Note (Addendum)
Patient with DJD disease and chronic back pain.  CT renal stone study without acute osseous finding.  Seems to be on oxycodone IR 5 mg every 4 hours as needed per narcotic database.  He is not on scheduled OxyContin at home per narcotic database. ?-Discontinue scheduled OxyContin given encephalopathy. ?-Continue Oxy IR 5 mg every 4 hours as needed moderate pain ?-IV fentanyl as needed for severe and breakthrough pain ?

## 2022-01-02 NOTE — Assessment & Plan Note (Addendum)
BP within acceptable range. ?-Continue amlodipine ?-Decrease metoprolol given COPD.  Not on metoprolol at home. ?-Change labetalol to p.o. hydralazine as needed given COPD. ?

## 2022-01-02 NOTE — Assessment & Plan Note (Addendum)
Followed at [facility]. ?-Discontinue Cymbalta.  Not on Cymbalta at home per pharmacy ?

## 2022-01-02 NOTE — Assessment & Plan Note (Addendum)
See AKI ?

## 2022-01-02 NOTE — Progress Notes (Signed)
?   01/02/22 0442  ?Vitals  ?BP (!) 184/88  ?BP Location Right Arm  ?BP Method Manual  ?Patient Position (if appropriate) Lying  ?MEWS COLOR  ?MEWS Score Color Green  ?Pain Assessment  ?Pain Scale 0-10  ?Pain Score 2  ?MEWS Score  ?MEWS Temp 0  ?MEWS Systolic 0  ?MEWS Pulse 0  ?MEWS RR 0  ?MEWS LOC 0  ?MEWS Score 0  ?Provider Notification  ?Provider Name/Title Sabino Gasser, MD  ?Date Provider Notified 01/02/22  ?Time Provider Notified [phone]  ?Notification Type  ?(securechat)  ?Notification Reason Other (Comment) ?(elevated BP)  ?Provider response See new orders  ?Date of Provider Response 01/02/22  ?Time of Provider Response [phone]  ? ? ?

## 2022-01-02 NOTE — Progress Notes (Signed)
Brief note: ?-Patient was admitted earlier today. ?-Available records reviewed ? ?Patient is a 71 year old male with past medical history significant for diabetes mellitus, hypertension, PTSD and COPD.  There is documentation of probable lung cancer diagnosis, but biopsy is said to be pending.  Patient is followed up by the [place].  Apparently, patient was to be admitted to [facility] for further work-up and treatment.  Patient was admitted with progressive weakness, poor p.o. intake and pain.  Abnormal electrolytes.  Noted on presentation (sodium of 133, potassium of 5.6 and serum creatinine of 2.2 with albumin of 2.5). ? ?-Patient does not seem to be in pain. ?-Patient has been on IV fluids.  Will discontinue.  Will change Lasix to torsemide.  -We will have low threshold to use intravenous Lasix if patient becomes significantly short of breath. ?-If patient will tolerate IV albumin, will consider giving IV albumin with Lasix.  This can be done tomorrow. ? ?Depending on patient's family's wish, oncology team may be consulted locally.  Patient is unable to give significant history. ? ?Overall, patient remains volume overloaded.  Blood pressure is elevated. ?

## 2022-01-02 NOTE — Assessment & Plan Note (Addendum)
Full comfort care ?

## 2022-01-02 NOTE — Assessment & Plan Note (Addendum)
Recent diagnosis at [place]. Per oncology fellow at [facility], Dr. Jason Fila, biopsy confirmed small cell lung cancer.  CT and CXR reveal multiple lung masses and adenopathy.  In-house oncology consulted per request by family.  Per oncology, extensive stage smoldering cancer.  Currently not a candidate for systemic treatment due to poor performance status.  He has a lot of comorbidities as well. ?-Comfort care initiated ?

## 2022-01-02 NOTE — Progress Notes (Signed)
?   01/02/22 8675 01/02/22 4492  ?Vitals  ?BP (!) 184/88 (!) 183/87 ?(Hydralazine PRN given)  ?BP Location  --  Right Arm  ?BP Method  --  Automatic  ?Patient Position (if appropriate)  --  Lying  ?Pulse Rate  --  75  ?Pulse Rate Source  --  [place]  ?MEWS COLOR  ?MEWS Score Color [name]  ?Height and Weight  ?Weight  --  79.2 kg  ?BMI (Calculated)  --  25.05  ?MEWS Score  ?MEWS Temp 0 0  ?MEWS Systolic 0 0  ?MEWS Pulse 0 0  ?MEWS RR 0 0  ?MEWS LOC 0 0  ?MEWS Score 0 0  ? ? ?

## 2022-01-02 NOTE — Assessment & Plan Note (Signed)
-   Continue home medication 

## 2022-01-02 NOTE — Assessment & Plan Note (Deleted)
Uncontrolled NIDDM-2 with hyperglycemia.  A1c 8.6%. ?Recent Labs  ?Lab 01/04/22 ?1250 01/04/22 ?1749 01/04/22 ?2116 01/05/22 ?7366 01/05/22 ?1123  ?GLUCAP 251* 157* 169* 95 127*  ?-Continue SSI-moderate ?-Hold Semglee at this morning. ?-Continue home Lipitor ?

## 2022-01-03 DIAGNOSIS — I10 Essential (primary) hypertension: Secondary | ICD-10-CM

## 2022-01-03 DIAGNOSIS — M545 Low back pain, unspecified: Secondary | ICD-10-CM

## 2022-01-03 DIAGNOSIS — G9340 Encephalopathy, unspecified: Secondary | ICD-10-CM | POA: Diagnosis not present

## 2022-01-03 DIAGNOSIS — C3490 Malignant neoplasm of unspecified part of unspecified bronchus or lung: Secondary | ICD-10-CM

## 2022-01-03 DIAGNOSIS — R1013 Epigastric pain: Secondary | ICD-10-CM

## 2022-01-03 DIAGNOSIS — N179 Acute kidney failure, unspecified: Secondary | ICD-10-CM | POA: Diagnosis not present

## 2022-01-03 DIAGNOSIS — E1165 Type 2 diabetes mellitus with hyperglycemia: Secondary | ICD-10-CM

## 2022-01-03 DIAGNOSIS — J439 Emphysema, unspecified: Secondary | ICD-10-CM

## 2022-01-03 DIAGNOSIS — G8929 Other chronic pain: Secondary | ICD-10-CM

## 2022-01-03 LAB — BLOOD GAS, VENOUS
Acid-Base Excess: 4.5 mmol/L — ABNORMAL HIGH (ref 0.0–2.0)
Bicarbonate: 31.2 mmol/L — ABNORMAL HIGH (ref 20.0–28.0)
O2 Saturation: 51.6 %
Patient temperature: 36.3
pCO2, Ven: 52 mmHg (ref 44–60)
pH, Ven: 7.38 (ref 7.25–7.43)
pO2, Ven: <31 mmHg — CL (ref 32–45)

## 2022-01-03 LAB — CBC WITH DIFFERENTIAL/PLATELET
Abs Immature Granulocytes: 0.26 10*3/uL — ABNORMAL HIGH (ref 0.00–0.07)
Basophils Absolute: 0 10*3/uL (ref 0.0–0.1)
Basophils Relative: 1 %
Eosinophils Absolute: 0.1 10*3/uL (ref 0.0–0.5)
Eosinophils Relative: 2 %
HCT: 35.9 % — ABNORMAL LOW (ref 39.0–52.0)
Hemoglobin: 11.9 g/dL — ABNORMAL LOW (ref 13.0–17.0)
Immature Granulocytes: 5 %
Lymphocytes Relative: 21 %
Lymphs Abs: 1.2 10*3/uL (ref 0.7–4.0)
MCH: 29.2 pg (ref 26.0–34.0)
MCHC: 33.1 g/dL (ref 30.0–36.0)
MCV: 88.2 fL (ref 80.0–100.0)
Monocytes Absolute: 0.3 10*3/uL (ref 0.1–1.0)
Monocytes Relative: 6 %
Neutro Abs: 3.8 10*3/uL (ref 1.7–7.7)
Neutrophils Relative %: 65 %
Platelets: 49 10*3/uL — ABNORMAL LOW (ref 150–400)
RBC: 4.07 MIL/uL — ABNORMAL LOW (ref 4.22–5.81)
RDW: 14.9 % (ref 11.5–15.5)
WBC: 5.7 10*3/uL (ref 4.0–10.5)
nRBC: 0.5 % — ABNORMAL HIGH (ref 0.0–0.2)

## 2022-01-03 LAB — GLUCOSE, CAPILLARY
Glucose-Capillary: 144 mg/dL — ABNORMAL HIGH (ref 70–99)
Glucose-Capillary: 150 mg/dL — ABNORMAL HIGH (ref 70–99)
Glucose-Capillary: 185 mg/dL — ABNORMAL HIGH (ref 70–99)
Glucose-Capillary: 202 mg/dL — ABNORMAL HIGH (ref 70–99)

## 2022-01-03 LAB — RENAL FUNCTION PANEL
Albumin: 2.1 g/dL — ABNORMAL LOW (ref 3.5–5.0)
Anion gap: 11 (ref 5–15)
BUN: 57 mg/dL — ABNORMAL HIGH (ref 8–23)
CO2: 28 mmol/L (ref 22–32)
Calcium: 9.1 mg/dL (ref 8.9–10.3)
Chloride: 100 mmol/L (ref 98–111)
Creatinine, Ser: 1.89 mg/dL — ABNORMAL HIGH (ref 0.61–1.24)
GFR, Estimated: 37 mL/min — ABNORMAL LOW (ref >60)
Glucose, Bld: 165 mg/dL — ABNORMAL HIGH (ref 70–99)
Phosphorus: 5.4 mg/dL — ABNORMAL HIGH (ref 2.5–4.6)
Potassium: 3.6 mmol/L (ref 3.5–5.1)
Sodium: 139 mmol/L (ref 135–145)

## 2022-01-03 LAB — AMMONIA: Ammonia: 17 umol/L (ref 9–35)

## 2022-01-03 LAB — TSH: TSH: 2.037 u[IU]/mL (ref 0.350–4.500)

## 2022-01-03 LAB — BASIC METABOLIC PANEL
Anion gap: 10 (ref 5–15)
BUN: 58 mg/dL — ABNORMAL HIGH (ref 8–23)
CO2: 28 mmol/L (ref 22–32)
Calcium: 9.1 mg/dL (ref 8.9–10.3)
Chloride: 101 mmol/L (ref 98–111)
Creatinine, Ser: 1.98 mg/dL — ABNORMAL HIGH (ref 0.61–1.24)
GFR, Estimated: 35 mL/min — ABNORMAL LOW (ref >60)
Glucose, Bld: 167 mg/dL — ABNORMAL HIGH (ref 70–99)
Potassium: 3.6 mmol/L (ref 3.5–5.1)
Sodium: 139 mmol/L (ref 135–145)

## 2022-01-03 LAB — VITAMIN B12: Vitamin B-12: 419 pg/mL (ref 180–914)

## 2022-01-03 LAB — BRAIN NATRIURETIC PEPTIDE: B Natriuretic Peptide: 151.7 pg/mL — ABNORMAL HIGH (ref 0.0–100.0)

## 2022-01-03 MED ORDER — GUAIFENESIN ER 600 MG PO TB12
600.0000 mg | ORAL_TABLET | Freq: Two times a day (BID) | ORAL | Status: DC
  Administered 2022-01-03: 600 mg via ORAL
  Filled 2022-01-03 (×3): qty 1

## 2022-01-03 MED ORDER — FENTANYL CITRATE PF 50 MCG/ML IJ SOSY
25.0000 ug | PREFILLED_SYRINGE | INTRAMUSCULAR | Status: DC | PRN
  Administered 2022-01-05: 25 ug via INTRAVENOUS
  Filled 2022-01-03: qty 1

## 2022-01-03 MED ORDER — IPRATROPIUM-ALBUTEROL 0.5-2.5 (3) MG/3ML IN SOLN
3.0000 mL | RESPIRATORY_TRACT | Status: DC | PRN
  Administered 2022-01-05: 3 mL via RESPIRATORY_TRACT
  Filled 2022-01-03 (×2): qty 3

## 2022-01-03 MED ORDER — ACETAMINOPHEN 500 MG PO TABS
1000.0000 mg | ORAL_TABLET | Freq: Three times a day (TID) | ORAL | Status: DC
  Administered 2022-01-03 – 2022-01-05 (×6): 1000 mg via ORAL
  Filled 2022-01-03 (×6): qty 2

## 2022-01-03 MED ORDER — OXYCODONE HCL 5 MG PO TABS
5.0000 mg | ORAL_TABLET | Freq: Four times a day (QID) | ORAL | Status: DC | PRN
  Administered 2022-01-05: 5 mg via ORAL
  Filled 2022-01-03: qty 1

## 2022-01-03 NOTE — Assessment & Plan Note (Addendum)
AKI resolved. ?

## 2022-01-03 NOTE — Evaluation (Signed)
Occupational Therapy Evaluation ?Patient Details ?Name: Lucas Ortiz ?MRN: [id number] ?DOB: 1951-07-23 ?Today's Date: 01/03/2022 ? ? 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 intake and increased pain, and admitted for AKI and acute on chronic back pain.  ? ?Clinical Impression ?  ?Mr. Lucas Ortiz is a 71 year old man admitted to hospital with above medical history and presents with generalized weakness, decreased activity tolerance, impaired cardiopulmonary endurance, impaired balance and altered mental status. Patient found on 4 L [place] with o2 sat at 94% and dyspneic at rest therefore activity limited. Patient needing min assist to stand with walker and increased assistance with ADLs including max-total assist for LB ADLs and toileting. Patient's o2 sat dropped to 79% with just standing (approx 1 minute to encourage urination). Took 3-4 minutes to recover but required 5 L to get patient above 86%. Left on 4 L at 93%. Patient will benefit from skilled OT services while in hospital to improve deficits and learn compensatory strategies as needed in order to return to PLOF.  Family in room expect patient to return home at discharge. Will recommend [facility] OT this time.  ?  ?   ? ?Recommendations for follow up therapy are one component of a multi-disciplinary discharge planning process, led by the attending physician.  Recommendations may be updated based on patient status, additional functional criteria and insurance authorization.  ? ?Follow Up Recommendations ? Home health OT  ?  ?Assistance Recommended at Discharge Frequent or constant Supervision/Assistance  ?Patient can return home with the following A little help with walking and/or transfers;A little help with bathing/dressing/bathroom;Assistance with cooking/housework;Direct supervision/assist for  medications management;Direct supervision/assist for financial management;Help with stairs or ramp for entrance;Assist for transportation ? ?  ?Functional Status Assessment ? Patient has had a recent decline in their functional status and demonstrates the ability to make significant improvements in function in a reasonable and predictable amount of time.  ?Equipment Recommendations ?  (TBD)  ?  ?Recommendations for Other Services   ? ? ?  ?Precautions / Restrictions Precautions ?Precautions: Fall ?Precaution Comments: monitor sats/ HR ?Restrictions ?Weight Bearing Restrictions: No  ? ?  ? ?Mobility Bed Mobility ?  ?  ?  ?  ?  ?  ?  ?  ?  ? ?Transfers ?  ?  ?  ?  ?  ?  ?  ?  ?  ?  ?  ? ?  ?Balance Overall balance assessment: Needs assistance ?Sitting-balance support: No upper extremity supported, Feet supported ?Sitting balance-Leahy Scale: Fair ?  ?  ?Standing balance support: During functional activity ?Standing balance-Leahy Scale: Poor ?Standing balance comment: reliant on walker ?  ?  ?  ?  ?  ?  ?  ?  ?  ?  ?  ?   ? ?ADL either performed or assessed with clinical judgement  ? ?ADL Overall ADL's : Needs assistance/impaired ?Eating/Feeding: Set up;Sitting ?  ?Grooming: Set up;Sitting ?  ?Upper Body Bathing: Moderate assistance;Sitting ?  ?Lower Body Bathing: Maximal assistance;Sitting/lateral leans ?  ?Upper Body Dressing : Sitting;Minimal assistance ?  ?Lower Body Dressing: Maximal assistance;Sitting/lateral leans ?  ?Toilet Transfer: Minimal assistance;Rolling walker (2 wheels);BSC/3in1 ?  ?Toileting- Clothing Manipulation and Hygiene: Total assistance ?Toileting - Clothing Manipulation Details (indicate cue type and reason): use of external catheter ?  ?  ?Functional mobility during ADLs:  Minimal assistance;Rolling walker (2 wheels) ?General ADL Comments: Minimal activity today secondary to dyspnea.  ? ? ? ?Vision   ?Vision Assessment?: No apparent visual deficits  ?   ?Perception   ?  ?Praxis   ?   ? ?Pertinent Vitals/Pain Pain Assessment ?Pain Assessment: No/denies pain  ? ? ? ?Hand Dominance Right ?  ?Extremity/Trunk Assessment Upper Extremity Assessment ?Upper Extremity Assessment: Generalized weakness (functional ROM, able to use arms for functional mobility on walker and chair) ?  ?Lower Extremity Assessment ?Lower Extremity Assessment: Defer to PT evaluation ?  ?Cervical / Trunk Assessment ?Cervical / Trunk Assessment: Normal ?  ?Communication Communication ?Communication: No difficulties ?  ?Cognition Arousal/Alertness: Awake/alert ?Behavior During Therapy: [facility] for tasks assessed/performed ?Overall Cognitive Status: Difficult to assess ?  ?  ?  ?  ?  ?  ?  ?  ?  ?  ?  ?  ?  ?  ?  ?  ?General Comments: Difficult to assess - doesn't speak much today. Reports he is at [facility] health. Grandson helps with PLOF today. ?  ?  ?General Comments    ? ?  ?Exercises   ?  ?Shoulder Instructions    ? ? ?Home Living Family/patient expects to be discharged to:: Private residence ?Living Arrangements: Alone ?Available Help at Discharge: Family;Available PRN/intermittently (daughter) ?Type of Home: House ?Home Access: Level entry ?  ?  ?Home Layout: One level ?  ?  ?Bathroom Shower/Tub: Tub/shower unit ?  ?Bathroom Toilet: Standard ?  ?  ?Home Equipment: Conservation officer, nature (2 wheels);Cane - single point ?  ?  ?  ? ?  ?Prior Functioning/Environment Prior Level of Function : Independent/Modified Independent ?  ?  ?  ?  ?  ?  ?Mobility Comments: predominantly doesn't use a device - only PRN ?ADLs Comments: independent - performs bathes in tub ?  ? ?  ?  ?OT Problem List: Decreased strength;Decreased activity tolerance;Impaired balance (sitting and/or standing);Decreased cognition;Decreased safety awareness;Decreased knowledge of use of DME or AE;Cardiopulmonary status limiting activity ?  ?   ?OT Treatment/Interventions: Self-care/ADL training;Therapeutic exercise;Energy conservation;DME and/or AE instruction;Therapeutic  activities;Balance training;Patient/family education  ?  ?OT Goals(Current goals can be found in the care plan section) Acute Rehab OT Goals ?Patient Stated Goal: move around more, ambulate to bathroom ?OT Goal Formulation: With patient ?Time For Goal Achievement: 01/17/22 ?Potential to Achieve Goals: Fair  ?OT Frequency: Min 2X/week ?  ? ?Co-evaluation   ?  ?  ?  ?  ? ?  ?AM-PAC OT "6 Clicks" Daily Activity     ?Outcome Measure Help from another person eating meals?: A Little ?Help from another person taking care of personal grooming?: A Little ?Help from another person toileting, which includes using toliet, bedpan, or urinal?: Total ?Help from another person bathing (including washing, rinsing, drying)?: A Lot ?Help from another person to put on and taking off regular upper body clothing?: A Little ?Help from another person to put on and taking off regular lower body clothing?: A Lot ?6 Click Score: 14 ?  ?End of Session Equipment Utilized During Treatment: Rolling walker (2 wheels) ?Nurse Communication: Mobility status (o2 s ats) ? ?Activity Tolerance: Patient limited by fatigue ?Patient left: in chair;with call bell/phone within reach;with family/visitor present ? ?OT Visit Diagnosis: Muscle weakness (generalized) (M62.81)  ?              ?Time: 7062-3762 ?OT Time Calculation (min): 22 min ?Charges:  OT General Charges ?$OT Visit: 1 Visit ?OT  Evaluation ?$OT Eval Low Complexity: 1 Low ? ?Savas Elvin, OTR/L ?Acute Care Rehab Services  ?Office [phone] ?Pager: [phone]  ? ?Tanina Barb L Alyssha Housh ?01/03/2022, 3:33 PM ?

## 2022-01-03 NOTE — Progress Notes (Signed)
?PROGRESS NOTE ? ?OHM DENTLER IWP:[id number] DOB: 08/08/1951  ? ?PCP: System, Provider Not In ? ?Patient is from: Home.  Lives with daughter. ? ?DOA: 12/12/2021 LOS: 1 ? ?Chief complaints ?Chief Complaint  ?Patient presents with  ? Weakness  ?  ? 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 intake and increased pain, and admitted for AKI and acute on chronic back pain.  Initially started on IV fluid which was later discontinued.  Then started on p.o. diuretics.  ? ?He seems to have acute encephalopathy likely from pain medications and possible hospital delirium.  ? ?Subjective: ?Seen and examined earlier this morning.  No major events overnight or this morning.  He is a sleepy but wakes to voice.  He is only oriented to self, person and place.  Not a great historian.  He responds no to pain, shortness of breath, nausea or vomiting but he falls back to sleep.  He follows commands.  He has bilateral mittens. ? ?Objective: ?Vitals:  ? 01/03/22 0622 01/03/22 0924 01/03/22 1100 01/03/22 1337  ?BP: 132/60   134/67  ?Pulse: 76   91  ?Resp: 18   18  ?Temp: 97.6 ?F (36.4 ?C)   97.7 ?F (36.5 ?C)  ?TempSrc: Oral   Oral  ?SpO2: 99% 98% 95% 93%  ?Weight:      ?Height:      ? ? ?Examination: ? ?GENERAL: No apparent distress.  Nontoxic. ?HEENT: MMM.  Vision and hearing grossly intact.  ?NECK: Supple.  No apparent JVD.  ?RESP:  No IWOB.  Fair aeration bilaterally. ?CVS:  RRR. Heart sounds normal.  ?ABD/GI/GU: BS+. Abd soft, NTND.  ?MSK/EXT:  Moves extremities. No apparent deformity.  Trace BLE edema. ?SKIN: no apparent skin lesion or wound ?NEURO: Sleepy.  Wakes to voice.  Oriented to self, person and place.  Follows commands.  No apparent focal neuro deficit but limited exam due to mental status ?PSYCH: Calm. Normal affect.  ? ?Procedures:  ?None ? ?Microbiology  summarized: ?None ? ?Assessment and Plan: ?* AKI (acute kidney injury) ([place]) ?Recent Labs  ?  12/28/2021 ?2120 12/28/2021 ?2133 01/02/22 ?1649 01/03/22 ?5053  ?BUN 65* 84* 51* 57*  58*  ?CREATININE 2.17* 2.20* 1.78* 1.89*  1.98*  ?Baseline Cr ranges from 1.7-1.9.  Initially improved with IV fluid. Cr slightly up after starting Demadex. ?-Hold Demadex ?-Strict intake and output ?-Recheck renal function in the morning ? ? ?Acute encephalopathy ?Per daughter, cognitive decline lately and some concern about dementia but significantly worse in the hospital.  He is a sleepy.  Oriented to self, place and daughter.  Follows commands.  Suspect toxic encephalopathy from opiate.  VBG, ammonia and B12 unrevealing.  No focal neurodeficit to suggest CVA. ?-Minimum oxygen to keep saturation above 88% despite home 5 L.  Discussed with RN. ?-Discontinue scheduled oxycodone.  Not on this at home. ?-Reorientation and delirium precautions. ?-Fall and aspiration precaution ? ?Back pain ?Patient with DJD disease and chronic back pain.  CT renal stone study without acute osseous finding.  Seems to be on oxycodone IR 5 mg every 4 hours as needed per narcotic database.  He is not on scheduled OxyContin at home per narcotic database. ?-Discontinue scheduled OxyContin given encephalopathy. ?-Continue Oxy IR 5 mg every 4 hours as needed moderate pain ?-IV fentanyl as needed for severe and breakthrough pain ? ?  Lung cancer ([place]) ?Per daughter recent diagnosis. CT and CXR reveal multiple lung masses and adenopathy. He has had Bx recently results pending. He has seen oncology at [facility] and was to be admitted to [facility] for continued evaluation and treatment. ?-Follow-up at [facility] after discharge ? ?COPD (chronic obstructive pulmonary disease) with emphysema ([place]) ?CXR with emphysematous changes.  Seems to be stable ?-Continue home breathing treatments ?-Wean oxygen as able ? ?DM2 (diabetes mellitus, type 2) ([place]) ?Uncontrolled  NIDDM-2 with hyperglycemia.  A1c 8.6%. ?Recent Labs  ?Lab 01/02/22 ?1233 01/02/22 ?1728 01/02/22 ?2113 01/03/22 ?5361 01/03/22 ?1216  ?GLUCAP 143* 117* 163* 144* 150*  ?-Continue current insulin regimen ?-Continue home Lipitor ? ? ?Dyspepsia ?Does not seem to be on medication for this at home. ?-Continue p.o. Protonix for now ? ?PTSD (post-traumatic stress disorder) ?Followed at [facility] ?-Continue home meds. ? ?HTN (hypertension) ?BP within acceptable range. ?-Continue home meds. ? ?CKD (chronic kidney disease), stage III ([place]) ?See AKI ? ?Hyperlipidemia ?Continue home medication ? ? ?DVT prophylaxis:  ?enoxaparin (LOVENOX) injection 30 mg Start: 01/02/22 1000 ? ?Code Status: Full code ?Family Communication: Updated patient's daughter at bedside. ?Level of care: Telemetry ?Status is: Inpatient ?Remains inpatient appropriate because: AKI and acute encephalopathy ? ? ?Final disposition: Likely home once medically stable. ? ?Consultants:  ?None ? ?Sch Meds:  ?Scheduled Meds: ? amLODipine  10 mg Oral Daily  ? aspirin EC  81 mg Oral Daily  ? atorvastatin  20 mg Oral Daily  ? budesonide-formoterol  2 puff Inhalation BID  ? DULoxetine  60 mg Oral Daily  ? enoxaparin (LOVENOX) injection  30 mg Subcutaneous Q24H  ? insulin aspart  0-15 Units Subcutaneous TID WC  ? metoprolol tartrate  50 mg Oral BID  ? pantoprazole  40 mg Oral Daily  ? QUEtiapine  100 mg Oral QHS  ? senna  1 tablet Oral BID  ? tamsulosin  0.4 mg Oral Daily  ? umeclidinium bromide  1 puff Inhalation Daily  ? ?Continuous Infusions: ?PRN Meds:.hydrALAZINE, labetalol, oxyCODONE ? ?Antimicrobials: ?Anti-infectives (From admission, onward)  ? ? None  ? ?  ? ? ? ?I have personally reviewed the following labs and images: ?CBC: ?Recent Labs  ?Lab 01/05/2022 ?2120 12/31/2021 ?2133 01/02/22 ?1649 01/03/22 ?4431  ?WBC 8.2  --  9.6 5.7  ?NEUTROABS 6.2  --  6.3 3.8  ?HGB 13.5 12.6* 13.7 11.9*  ?HCT 39.4 37.0* 41.6 35.9*  ?MCV 85.1  --  90.0 88.2  ?PLT 67*  --  63* 49*  ? ?BMP  &GFR ?Recent Labs  ?Lab 12/30/2021 ?2120 12/25/2021 ?2133 01/02/22 ?1649 01/03/22 ?5400  ?NA 134* 133* 137 139  139  ?K 3.5 5.6* 4.1 3.6  3.6  ?CL 95* 95* 100 100  101  ?CO2 28  --  28 28  28   ?GLUCOSE 304* 305* 135* 165*  167*  ?BUN 65* 84* 51* 57*  58*  ?CREATININE 2.17* 2.20* 1.78* 1.89*  1.98*  ?CALCIUM 9.4  --  9.2 9.1  9.1  ?MG  --   --  2.2  --   ?PHOS  --   --  3.9 5.4*  ? ?Estimated Creatinine Clearance: 34.8 mL/min (A) (by C-G formula based on SCr of 1.98 mg/dL (H)). ?Liver & Pancreas: ?Recent Labs  ?Lab 12/23/2021 ?2120 01/02/22 ?1649 01/03/22 ?8676  ?AST 76*  --   --   ?ALT 37  --   --   ?ALKPHOS 115  --   --   ?BILITOT  0.6  --   --   ?PROT 6.3*  --   --   ?ALBUMIN 2.5* 2.6* 2.1*  ? ?No results for input(s): LIPASE, AMYLASE in the last 168 hours. ?Recent Labs  ?Lab 01/03/22 ?1057  ?AMMONIA 17  ? ?Diabetic: ?Recent Labs  ?  01/02/2022 ?2120  ?HGBA1C 8.6*  ? ?Recent Labs  ?Lab 01/02/22 ?1233 01/02/22 ?1728 01/02/22 ?2113 01/03/22 ?1840 01/03/22 ?1216  ?GLUCAP 143* 117* 163* 144* 150*  ? ?Cardiac Enzymes: ?No results for input(s): CKTOTAL, CKMB, CKMBINDEX, TROPONINI in the last 168 hours. ?No results for input(s): PROBNP in the last 8760 hours. ?Coagulation Profile: ?No results for input(s): INR, PROTIME in the last 168 hours. ?Thyroid Function Tests: ?Recent Labs  ?  01/03/22 ?3754  ?TSH 2.037  ? ?Lipid Profile: ?No results for input(s): CHOL, HDL, LDLCALC, TRIG, CHOLHDL, LDLDIRECT in the last 72 hours. ?Anemia Panel: ?Recent Labs  ?  01/03/22 ?3606  ?VITAMINB12 419  ? ?Urine analysis: ?   ?Component Value Date/Time  ? COLORURINE YELLOW 12/18/2021 2125  ? APPEARANCEUR CLEAR 01/02/2022 2125  ? LABSPEC 1.018 12/31/2021 2125  ? PHURINE 5.0 12/16/2021 2125  ? GLUCOSEU >=500 (A) 12/25/2021 2125  ? HGBUR SMALL (A) 12/24/2021 2125  ? [place] NEGATIVE 01/04/2022 2125  ? [place] NEGATIVE 12/26/2021 2125  ? PROTEINUR >=300 (A) 12/31/2021 2125  ? NITRITE NEGATIVE 12/29/2021 2125  ? LEUKOCYTESUR NEGATIVE  12/28/2021 2125  ? ?Sepsis Labs: ?Invalid input(s): PROCALCITONIN, LACTICIDVEN ? ?Microbiology: ?No results found for this or any previous visit (from the past 240 hour(s)). ? ?Radiology Studies: ?No results found. ? ? ? ?

## 2022-01-03 NOTE — TOC Initial Note (Signed)
Transition of Care (TOC) - Initial/Assessment Note  ? ? ?Patient Details  ?Name: Lucas Ortiz ?MRN: [id number] ?Date of Birth: 1951-08-05 ? ?Transition of Care (TOC) CM/SW Contact:    ?Tawanna Cooler, RN ?Phone Number: ?01/03/2022, 4:18 PM ? ?Clinical Narrative:                 ? ?Lives at home with his daughter.  Has been confused, possibly due to pain meds or hospital delirium.  Notes state that patient has home O2.  Recent dx of probable lung cancer, biopsy results still pending. ?  ?TOC following for discharge needs.   ? ?Expected Discharge Plan: [place] ?Barriers to Discharge: Continued Medical Work up ? ? ?Expected Discharge Plan and Services ?Expected Discharge Plan: [place] ?  ?  ?  ?Living arrangements for the past 2 months: [place] ?                ?  ?Prior Living Arrangements/Services ?Living arrangements for the past 2 months: [place] ?Lives with:: Adult Children ?Patient language and need for interpreter reviewed:: Yes ?       ?Need for Family Participation in Patient Care: Yes (Comment) ?Care giver support system in place?: Yes (comment) ?  ?Criminal Activity/Legal Involvement Pertinent to Current Situation/Hospitalization: No - Comment as needed ? ?Activities of Daily Living ?Home Assistive Devices/Equipment: Blood pressure cuff, CBG Meter, Grab bars in shower, Nebulizer, Shower chair without back, Environmental consultant (specify type), Cane (specify quad or straight), Eyeglasses, Wheelchair ?ADL Screening (condition at time of admission) ?Patient's cognitive ability adequate to safely complete daily activities?: No ?Is the patient deaf or have difficulty hearing?: No ?Does the patient have difficulty seeing, even when wearing glasses/contacts?: No ?Does the patient have difficulty concentrating, remembering, or making decisions?: Yes ?Patient able to express need for assistance with ADLs?: Yes ?Does the patient have difficulty dressing or bathing?:  Yes ?Independently performs ADLs?: No ?Communication: Independent ?Dressing (OT): Needs assistance ?Is this a change from baseline?: Change from baseline, expected to last >3 days ?Grooming: Needs assistance ?Is this a change from baseline?: Change from baseline, expected to last >3 days ?Feeding: Independent ?Bathing: Needs assistance ?Is this a change from baseline?: Change from baseline, expected to last >3 days ?Toileting: Needs assistance ?Is this a change from baseline?: Change from baseline, expected to last >3days ?In/Out Bed: Needs assistance ?Is this a change from baseline?: Change from baseline, expected to last >3 days ?Walks in Home: Needs assistance (walks with walker and wheelchair per patient, but falls quite a bit.) ?Is this a change from baseline?: Change from baseline, expected to last >3 days ?Does the patient have difficulty walking or climbing stairs?: Yes ?Weakness of Legs: Both ?Weakness of Arms/Hands: Both ? ?  ?Orientation: : Oriented to Self, Oriented to Place ?Alcohol / Substance Use: Not Applicable ?Psych Involvement: No (comment) ? ?Admission diagnosis:  AKI (acute kidney injury) ([place]) [N17.9] ?Metastatic malignant neoplasm, unspecified site [facility]) [C79.9] ?FTT (failure to thrive) in adult [R62.7] ?Patient Active Problem List  ? Diagnosis Date Noted  ? Acute encephalopathy 01/03/2022  ? DM2 (diabetes mellitus, type 2) ([place]) 01/02/2022  ? PTSD (post-traumatic stress disorder) 01/02/2022  ? Dyspepsia 01/02/2022  ? COPD (chronic obstructive pulmonary disease) with emphysema ([place]) 01/02/2022  ? Lung cancer ([place]) 01/02/2022  ? Back pain 01/02/2022  ? AKI (acute kidney injury) ([place]) 01/02/2022  ? FTT (failure to thrive) in adult 01/02/2022  ? Pneumonia  12/14/2016  ? Acute on chronic respiratory failure with hypoxia ([place]) 12/14/2016  ? CKD (chronic kidney disease), stage III ([place]) 12/14/2016  ? Hypokalemia 12/14/2016  ? HTN (hypertension) 12/14/2016  ? Lobar pneumonia ([place])   ? Acute  exacerbation of chronic obstructive pulmonary disease (COPD) ([place])   ? Hyperlipidemia   ? ?PCP:  System, Provider Not In ?Pharmacy:   ?[place] ([place]), [place] - [address] ?[address] ?[place] [address] ?Phone: [phone] Fax: [phone] ? ?[place], [place]. ?[place]. ?[place] [address] ?Phone: [phone] Fax: [phone] ? ? ? ?Readmission Risk Interventions ? ?  01/03/2022  ?  4:18 PM  ?Readmission Risk Prevention Plan  ?Transportation Screening Complete  ?PCP or Specialist Appt within 3-5 Days Complete  ?[place] or Home Care Consult Complete  ?Social Work Consult for [place] Planning/Counseling Complete  ?Palliative Care Screening Complete  ?Medication Review Press photographer) Complete  ? ? ? ?

## 2022-01-03 NOTE — Assessment & Plan Note (Addendum)
Some improvement after discontinuing scheduled opiate and reducing Seroquel.  ?

## 2022-01-03 NOTE — Progress Notes (Signed)
?   01/03/22 1130  ?Notify: Provider  ?Provider Name/Title Dr. Cyndia Skeeters  ?Date Provider Notified 01/03/22  ?Time Provider Notified 1130  ?Notification Type Page  ?Notification Reason Critical result ?(p02 less than 31)  ?Provider response See new orders  ?Date of Provider Response 01/03/22  ?Time of Provider Response 1139  ? ? ?

## 2022-01-03 NOTE — Hospital Course (Addendum)
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 intake and increased pain, and admitted for AKI and acute on chronic back pain.  Initially started on IV fluid which was later discontinued.  Then started on p.o. Demadex which was also discontinued given rising creatinine.  ? ?Patient was referred to oncology group at [facility] for evaluation and treatment of his lung cancer.  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 However, patient's daughter prefers oncology follow-up here.  So in-house oncology consulted. ? ?Hospital course complicated by acute encephalopathy (likely iatrogenic) and respiratory distress likely from COPD exacerbation and possibly from underlying lung cancer.  Encephalopathy improved after adjusting meds.  He was a started on IV Solu-Medrol, IV antibiotics and nebulizers for possible COPD exacerbation no respiratory distress, and transferred to progressive care for BiPAP as needed.  PCCM consulted.  ? ?Eventually, goal of care discussion initiated.  Family decided to pursue full comfort care on 01/05/2022.  Initially, plan was for transfer to [facility] if bed available and stable for transfer.  However, patient declined and passed away on 02/04/2022 at 8:12 AM with family members at bedside. ? ? ? ?

## 2022-01-04 DIAGNOSIS — N179 Acute kidney failure, unspecified: Secondary | ICD-10-CM | POA: Diagnosis not present

## 2022-01-04 DIAGNOSIS — N1832 Chronic kidney disease, stage 3b: Secondary | ICD-10-CM

## 2022-01-04 DIAGNOSIS — G9340 Encephalopathy, unspecified: Secondary | ICD-10-CM | POA: Diagnosis not present

## 2022-01-04 DIAGNOSIS — I4729 Other ventricular tachycardia: Secondary | ICD-10-CM

## 2022-01-04 DIAGNOSIS — J439 Emphysema, unspecified: Secondary | ICD-10-CM | POA: Diagnosis not present

## 2022-01-04 DIAGNOSIS — M545 Low back pain, unspecified: Secondary | ICD-10-CM | POA: Diagnosis not present

## 2022-01-04 DIAGNOSIS — D61818 Other pancytopenia: Secondary | ICD-10-CM

## 2022-01-04 LAB — RENAL FUNCTION PANEL
Albumin: 2.2 g/dL — ABNORMAL LOW (ref 3.5–5.0)
Anion gap: 10 (ref 5–15)
BUN: 64 mg/dL — ABNORMAL HIGH (ref 8–23)
CO2: 28 mmol/L (ref 22–32)
Calcium: 9.4 mg/dL (ref 8.9–10.3)
Chloride: 104 mmol/L (ref 98–111)
Creatinine, Ser: 2.09 mg/dL — ABNORMAL HIGH (ref 0.61–1.24)
GFR, Estimated: 33 mL/min — ABNORMAL LOW (ref >60)
Glucose, Bld: 179 mg/dL — ABNORMAL HIGH (ref 70–99)
Phosphorus: 5.6 mg/dL — ABNORMAL HIGH (ref 2.5–4.6)
Potassium: 3.7 mmol/L (ref 3.5–5.1)
Sodium: 142 mmol/L (ref 135–145)

## 2022-01-04 LAB — GLUCOSE, CAPILLARY
Glucose-Capillary: 160 mg/dL — ABNORMAL HIGH (ref 70–99)
Glucose-Capillary: 251 mg/dL — ABNORMAL HIGH (ref 70–99)
Glucose-Capillary: 157 mg/dL — ABNORMAL HIGH (ref 70–99)
Glucose-Capillary: 169 mg/dL — ABNORMAL HIGH (ref 70–99)

## 2022-01-04 LAB — CBC
HCT: 35.9 % — ABNORMAL LOW (ref 39.0–52.0)
Hemoglobin: 11.9 g/dL — ABNORMAL LOW (ref 13.0–17.0)
MCH: 29.2 pg (ref 26.0–34.0)
MCHC: 33.1 g/dL (ref 30.0–36.0)
MCV: 88.2 fL (ref 80.0–100.0)
Platelets: 47 10*3/uL — ABNORMAL LOW (ref 150–400)
RBC: 4.07 MIL/uL — ABNORMAL LOW (ref 4.22–5.81)
RDW: 14.9 % (ref 11.5–15.5)
WBC: 3.6 10*3/uL — ABNORMAL LOW (ref 4.0–10.5)
nRBC: 1.1 % — ABNORMAL HIGH (ref 0.0–0.2)

## 2022-01-04 LAB — RPR: RPR Ser Ql: NONREACTIVE

## 2022-01-04 LAB — MAGNESIUM: Magnesium: 2.4 mg/dL (ref 1.7–2.4)

## 2022-01-04 MED ORDER — METOPROLOL TARTRATE 12.5 MG HALF TABLET
12.5000 mg | ORAL_TABLET | Freq: Two times a day (BID) | ORAL | Status: DC
  Administered 2022-01-04: 12.5 mg via ORAL
  Filled 2022-01-04: qty 1

## 2022-01-04 MED ORDER — HYDRALAZINE HCL 25 MG PO TABS
25.0000 mg | ORAL_TABLET | Freq: Four times a day (QID) | ORAL | Status: DC | PRN
Start: 2022-01-04 — End: 2022-01-05

## 2022-01-04 MED ORDER — GUAIFENESIN 100 MG/5ML PO LIQD
10.0000 mL | ORAL | Status: DC
  Administered 2022-01-04 – 2022-01-05 (×5): 10 mL via ORAL
  Filled 2022-01-04 (×5): qty 10

## 2022-01-04 MED ORDER — INSULIN GLARGINE-YFGN 100 UNIT/ML ~~LOC~~ SOLN
10.0000 [IU] | Freq: Every day | SUBCUTANEOUS | Status: DC
  Administered 2022-01-04: 10 [IU] via SUBCUTANEOUS
  Filled 2022-01-04 (×2): qty 0.1

## 2022-01-04 MED ORDER — QUETIAPINE FUMARATE 50 MG PO TABS
25.0000 mg | ORAL_TABLET | Freq: Every day | ORAL | Status: DC
  Administered 2022-01-04: 25 mg via ORAL
  Filled 2022-01-04: qty 1

## 2022-01-04 MED ORDER — GLYCOPYRROLATE 1 MG PO TABS
1.0000 mg | ORAL_TABLET | Freq: Two times a day (BID) | ORAL | Status: DC | PRN
  Administered 2022-01-04 – 2022-01-05 (×2): 1 mg via ORAL
  Filled 2022-01-04 (×2): qty 1

## 2022-01-04 NOTE — Assessment & Plan Note (Addendum)
Resolved except for thrombocytopenia which has also improved. ?-Appreciate input by oncology ?

## 2022-01-04 NOTE — Evaluation (Signed)
Physical Therapy Evaluation ?Patient Details ?Name: Lucas Ortiz ?MRN: [id number] ?DOB: 12/17/1950 ?Today's Date: 01/04/2022 ? ?History of Present Illness ? Pt is a 71 y.o. male presenting to [facility] ED on 12/27/2021 with chief complain of weakness and pain, found to have AKI and acute on chronic back pain.   Recent diagnosis of lung cancer with PMH for DM, HTN, COPD, PTSD. ? ?  ?Clinical Impression ? Lucas Ortiz is 71 y.o. male admitted with above HPI and diagnosis. Patient is currently limited by functional impairments below (see PT problem list). Patient lives alone and is independent at baseline. Patient on 4L O2 at start with SpO2 of 91% in supine. Pt require mod assist to sit up to EOB and desat to mid 80's, O2 increased to 6L and pt recovered briefly to 87%. Pt's WOB increased and pt c/o greater SOB, he was able to stand 2x with min assist and move towards [facility] but unable to progress as he desaturated to80% and required O2 to be increased to 10L/ Pt mouth breathing throughout and NRB provided at EOS with pt recovered to 93%. RN notified. Patient will benefit from continued skilled PT interventions to address impairments and progress independence with mobility, recommending pt return home with family and HHPT if he is able to progress. If pt does not progress mobility he will benefit from ST rehab at [facility], will update recs as appropriate. Acute PT will follow and progress as able.  ?   ? ?Recommendations for follow up therapy are one component of a multi-disciplinary discharge planning process, led by the attending physician.  Recommendations may be updated based on patient status, additional functional criteria and insurance authorization. ? ? ?PT Recommendation   ?Follow Up Recommendations Home health PT  [vs SNF if pt does not progress and family cannot provide needed assist] Filed 01/04/2022 1100  ?Assistance recommended at discharge Frequent or constant Supervision/Assistance Filed 01/04/2022 1100  ?Patient  can return home with the following A lot of help with walking and/or transfers, A lot of help with bathing/dressing/bathroom, Assistance with cooking/housework, Direct supervision/assist for medications management, Assist for transportation, Help with stairs or ramp for entrance Filed 01/04/2022 1100  ?Functional Status Assessment Patient has had a recent decline in their functional status and/or demonstrates limited ability to make significant improvements in function in a reasonable and predictable amount of time [facility] 01/04/2022 1100  ?PT equipment None recommended by PT Filed 01/04/2022 1100  ? ? ? ? 01/04/22 1100  ?PT Visit Information  ?Last PT Received On 01/04/22  ?Assistance Needed +1  ?History of Present Illness Pt is a 71 y.o. male presenting to [facility] ED on 12/27/2021 with chief complain of weakness and pain, found to have AKI and acute on chronic back pain.   Recent diagnosis of lung cancer with PMH for DM, HTN, COPD, PTSD.  ?Precautions  ?Precautions Fall  ?Precaution Comments monitor sats/ HR  ?Restrictions  ?Weight Bearing Restrictions No  ?Home Living  ?Family/patient expects to be discharged to: Private residence  ?Living Arrangements Alone  ?Available Help at Discharge Family;Available PRN/intermittently  ?Type of Home House  ?Home Access Stairs to enter  ?Entrance Stairs-Number of Steps 1  ?Entrance Stairs-Rails Right  ?Home Layout One level  ?Home Equipment [employer] (2 wheels);Cane - single point  ?Prior Function  ?Prior Level of Function  Independent/Modified Independent  ?Mobility Comments predominantly doesn't use a device - only PRN  ?ADLs Comments independent - performs bathes in tub  ?Communication  ?Communication  No difficulties  ?Pain Assessment  ?Pain Assessment Faces  ?Faces Pain Scale 4  ?Pain Location back with mobility  ?Pain Descriptors / Indicators Aching;Discomfort;Grimacing;Guarding  ?Pain Intervention(s) Limited activity within patient's tolerance;Monitored during  session;Repositioned  ?Cognition  ?Arousal/Alertness Awake/alert  ?Behavior During Therapy [facility] for tasks assessed/performed  ?Overall Cognitive Status Impaired/Different from baseline  ?Area of Impairment Orientation;Attention;Memory;Awareness;Safety/judgement;Following commands;Problem solving  ?Orientation Level Disoriented to;Place  ?Current Attention Level Selective  ?Memory Decreased short-term memory  ?Following Commands Follows one step commands with increased time;Follows multi-step commands with increased time;Follows multi-step commands inconsistently  ?Safety/Judgement Decreased awareness of deficits  ?Awareness Emergent  ?Problem Solving Slow processing;Decreased initiation;Difficulty sequencing;Requires verbal cues  ?Upper Extremity Assessment  ?Upper Extremity Assessment Generalized weakness;Defer to OT evaluation  ?Lower Extremity Assessment  ?Lower Extremity Assessment Generalized weakness ?(functional ROM, able to complete sit<>stand with min assist.)  ?Cervical / Trunk Assessment  ?Cervical / Trunk Assessment Normal  ?Bed Mobility  ?Overal bed mobility Needs Assistance  ?Bed Mobility Supine to Sit;Sit to Supine  ?Supine to sit Min assist;Mod assist;HOB elevated  ?Sit to supine Mod assist  ?General bed mobility comments Mod assist to fully raise trunk and min assist to bring LE's off EOB. pt very limited by SOB and increased WOB sitting EOB. pt desat to mid 80's and O2 incresaed to 6L/min. after pt completed 2 stand and WOB increased further pt requried Mod assist to return to supine.  ?Transfers  ?Overall transfer level Needs assistance  ?Equipment used Rolling walker (2 wheels)  ?Transfers Sit to/from Stand  ?Sit to Stand Min assist  ?General transfer comment Min assist with cues for hand placement to power up from EOB to RW. pt SOB and requried seated break after ~15 seconds. SpO2 dropped to low 80's and O2 incresed to 10L/min with pt recovering as high at 87%. pt stood 2nd time and took small  side steps with min assist for walker and cues for sequecning. returned to sit and back to bed.  ?Balance  ?Overall balance assessment Needs assistance  ?Sitting-balance support Feet supported;Bilateral upper extremity supported  ?Sitting balance-Leahy Scale Fair  ?Standing balance support Bilateral upper extremity supported  ?Standing balance-Leahy Scale Poor  ?Standing balance comment reliant on walker  ?PT - End of Session  ?Equipment Utilized During Treatment Gait belt;Oxygen  ?Activity Tolerance Treatment limited secondary to medical complications (Comment) ?(tachypnea, desat)  ?Patient left in bed;with call bell/phone within reach;with bed alarm set;with family/visitor present;with nursing/sitter in room  ?Nurse Communication Mobility status  ?PT Assessment  ?PT Recommendation/Assessment Patient needs continued PT services  ?PT Visit Diagnosis Muscle weakness (generalized) (M62.81);Difficulty in walking, not elsewhere classified (R26.2);Other abnormalities of gait and mobility (R26.89)  ?PT Problem List Decreased strength;Decreased activity tolerance;Decreased balance;Decreased mobility;Decreased cognition;Decreased knowledge of use of DME;Decreased safety awareness;Cardiopulmonary status limiting activity;Decreased knowledge of precautions  ?PT Plan  ?PT Frequency (ACUTE ONLY) Min 3X/week  ?PT Treatment/Interventions (ACUTE ONLY) DME instruction;Gait training;Stair training;Functional mobility training;Therapeutic activities;Therapeutic exercise;Balance training;Patient/family education  ?AM-PAC PT "6 Clicks" Mobility Outcome Measure (Version 2)  ?Help needed turning from your back to your side while in a flat bed without using bedrails? 3  ?Help needed moving from lying on your back to sitting on the side of a flat bed without using bedrails? 2  ?Help needed moving to and from a bed to a chair (including a wheelchair)? 3  ?Help needed standing up from a chair using your arms (e.g., wheelchair or bedside  chair)? 3  ?Help needed to walk in  hospital room? 1  ?Help needed climbing 3-5 steps with a railing?  1  ?6 Click Score 13  ?Consider Recommendation of Discharge To: CIR/SNF/LTACH  ?Progressive Mobility  ?What is the high

## 2022-01-04 NOTE — Assessment & Plan Note (Addendum)
Reportedly had about 20 runs of VT the night of 3/26. ?-Continue low-dose metoprolol ?-Optimize electrolytes ?-Discontinue Cymbalta and decrease Seroquel. ?-Continue telemetry monitoring ?

## 2022-01-04 NOTE — Progress Notes (Signed)
?PROGRESS NOTE ? ?Lucas Ortiz URK:[id number] DOB: Jun 13, 1951  ? ?PCP: System, Provider Not In ? ?Patient is from: Home.  Lives with daughter. ? ?DOA: 12/31/2021 LOS: 2 ? ?Chief complaints ?Chief Complaint  ?Patient presents with  ? Weakness  ?  ? 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 intake and increased pain, and admitted for AKI and acute on chronic back pain.  Initially started on IV fluid which was later discontinued.  Then started on p.o. Demadex which was also discontinued given rising creatinine.  ? ?Patient was referred to oncology group at [facility] for evaluation and treatment of his lung cancer.  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 However, patient's daughter prefers oncology follow-up here.  So in-house oncology consulted. ? ?Hospital course complicated by acute encephalopathy likely iatrogenic.  Encephalopathy improving after holding sedating medications. ?  ? ?Subjective: ?Seen and examined earlier this morning.  No major events overnight or this morning.  Reportedly had 20 beats of VT but not symptomatic.  Patient has no complaints.  Pain improved.  He is awake but not quite alert.  He is oriented x4 except date.  ? ?Objective: ?Vitals:  ? 01/03/22 2024 01/04/22 0447 01/04/22 0908 01/04/22 1504  ?BP: 130/73 133/73  136/71  ?Pulse: 74 78  92  ?Resp: 18 20    ?Temp: (!) 97.4 ?F (36.3 ?C) 97.8 ?F (36.6 ?C)  98 ?F (36.7 ?C)  ?TempSrc: Oral   Oral  ?SpO2: 97% 94% 95% 96%  ?Weight:      ?Height:      ? ? ?Examination: ? ?GENERAL: No apparent distress.  Nontoxic. ?HEENT: MMM.  Vision and hearing grossly intact.  ?NECK: Supple.  No apparent JVD.  ?RESP: 96% on 4 L.  No IWOB.  Rhonchi bilaterally. ?CVS:   RRR. Heart sounds normal.  ?ABD/GI/GU: BS+. Abd soft, NTND.  ?MSK/EXT:  Moves extremities. No apparent deformity.  Trace BLE edema. ?SKIN: no apparent skin lesion or wound ?NEURO: Awake but not alert.  Oriented x4 except date.  Follows commands.  No apparent focal neuro deficit. ?PSYCH: Calm. Normal affect.  ? ?Procedures:  ?None ? ?Microbiology summarized: ?None ? ?Assessment and Plan: ?* Acute renal failure superimposed on stage 3b chronic kidney disease ([place]) ?Recent Labs  ?  12/21/2021 ?2120 01/03/2022 ?2133 01/02/22 ?1649 01/03/22 ?8315 01/04/22 ?0448  ?BUN 65* 84* 51* 57*  58* 64*  ?CREATININE 2.17* 2.20* 1.78* 1.89*  1.98* 2.09*  ?Baseline Cr ranges from 1.7-1.9.  Initially improved with IV fluid. Cr slightly up after starting Demadex. ?-Discontinue Demadex.  Holding home Lasix. ?-Strict intake and output ?-Recheck renal function in the morning ? ?Acute encephalopathy ?Likely iatrogenic from scheduled opiates and high-dose Seroquel.  Improved.  VBG, ammonia, RPR and B12 unrevealing.  No focal neurodeficit to suggest CVA.  Low suspicion for infectious process. ?-Minimum oxygen to keep saturation above 88% despite home 5 L.  Discussed with RN. ?-Discontinued scheduled oxycodone.  Not on this at home per narcotic database. ?-Reduce Seroquel to 25 mg at night.  Not on Seroquel at home. ?-Reorientation and delirium precautions. ?-Fall and aspiration precaution ? ?Back pain ?Patient with DJD disease and chronic back pain.  CT renal stone study without acute osseous finding.  Seems  to be on oxycodone IR 5 mg every 4 hours as needed per narcotic database.  He is not on scheduled OxyContin at home per narcotic database. ?-Discontinue scheduled OxyContin given encephalopathy. ?-Continue Oxy IR 5 mg every 4 hours as needed moderate pain ?-IV fentanyl as needed for severe and breakthrough pain ? ?Lung cancer ([place]) ?Recent diagnosis at [place]. Per oncology fellow at [facility], Dr. Jason Fila, biopsy confirmed small cell lung  cancer.  CT and CXR reveal multiple lung masses and adenopathy.  ?-Dr. Gwynneth Macleod planning direct admission to initiate chemotherapy but daughter prefers oncology care at [facility].  ?-Oncology consulted. ? ?COPD (chronic obstructive pulmonary disease) with emphysema ([place]) ?CXR with emphysematous changes.  Seems to be stable ?-Continue home breathing treatments ?-Decrease metoprolol.  Does not seem to be metoprolol at home. ?-Wean oxygen as able ? ?DM2 (diabetes mellitus, type 2) ([place]) ?Uncontrolled NIDDM-2 with hyperglycemia.  A1c 8.6%. ?Recent Labs  ?Lab 01/03/22 ?1216 01/03/22 ?1541 01/03/22 ?2032 01/04/22 ?0753 01/04/22 ?1250  ?GLUCAP 150* 185* 202* 160* 251*  ?-Continue SSI-moderate ?-Add Semglee 10 units daily ?-Continue home Lipitor ? ?Pancytopenia ([place]) ?Thrombocytopenia seems to be chronic.  Anemia stable.  New leukopenia.  ?-Discontinued subcu Lovenox. ?-Oncology consulted ? ?NSVT (nonsustained ventricular tachycardia) ?Reportedly had about 20 runs of VT overnight.  Not symptomatic. ?-Continue low-dose metoprolol ?-Optimize electrolytes ?-Discontinue Cymbalta and decrease Seroquel. ?-Continue telemetry monitoring ? ?Dyspepsia ?Does not seem to be on medication for this at home. ?-Continue p.o. Protonix for now ? ?PTSD (post-traumatic stress disorder) ?Followed at [facility]. ?-Discontinue Cymbalta.  Not on Cymbalta at home per pharmacy ? ?HTN (hypertension) ?BP within acceptable range. ?-Continue amlodipine ?-Decrease metoprolol given COPD.  Not on metoprolol at home. ?-Change labetalol to p.o. hydralazine as needed given COPD. ? ?CKD (chronic kidney disease), stage III ([place]) ?See AKI ? ?Hyperlipidemia ?Continue home medication ? ? ?DVT prophylaxis:  ?Place and maintain sequential compression device Start: 01/04/22 0733 ? ?Code Status: Full code ?Family Communication: Updated patient's daughter over the phone. ?Level of care: Telemetry ?Status is: Inpatient ?Remains inpatient appropriate because: AKI, acute encephalopathy,  physical deconditioning and lung cancer ? ? ?Final disposition: TBD. ? ?Consultants:  ?Oncology ? ?Sch Meds:  ?Scheduled Meds: ? acetaminophen  1,000 mg Oral Q8H  ? amLODipine  10 mg Oral Daily  ? aspirin EC  81 mg Oral Daily  ? atorvastatin  20 mg Oral Daily  ? budesonide-formoterol  2 puff Inhalation BID  ? guaiFENesin  10 mL Oral Q4H while awake  ? insulin aspart  0-15 Units Subcutaneous TID WC  ? insulin glargine-yfgn  10 Units Subcutaneous Daily  ? metoprolol tartrate  12.5 mg Oral BID  ? pantoprazole  40 mg Oral Daily  ? QUEtiapine  25 mg Oral QHS  ? senna  1 tablet Oral BID  ? tamsulosin  0.4 mg Oral Daily  ? umeclidinium bromide  1 puff Inhalation Daily  ? ?Continuous Infusions: ?PRN Meds:.fentaNYL (SUBLIMAZE) injection, hydrALAZINE, ipratropium-albuterol, oxyCODONE ? ?Antimicrobials: ?Anti-infectives (From admission, onward)  ? ? None  ? ?  ? ? ? ?I have personally reviewed the following labs and images: ?CBC: ?Recent Labs  ?Lab 01/05/2022 ?2120 12/15/2021 ?2133 01/02/22 ?1649 01/03/22 ?9233 01/04/22 ?0448  ?WBC 8.2  --  9.6 5.7 3.6*  ?NEUTROABS 6.2  --  6.3 3.8  --   ?HGB 13.5 12.6* 13.7 11.9* 11.9*  ?HCT 39.4 37.0* 41.6 35.9* 35.9*  ?MCV 85.1  --  90.0 88.2 88.2  ?PLT 67*  --  63* 49*  47*  ? ?BMP &GFR ?Recent Labs  ?Lab 12/17/2021 ?2120 12/16/2021 ?2133 01/02/22 ?1649 01/03/22 ?7017 01/04/22 ?0448  ?NA 134* 133* 137 139  139 142  ?K 3.5 5.6* 4.1 3.6  3.6 3.7  ?CL 95* 95* 100 100  101 104  ?CO2 28  --  28 28  28 28   ?GLUCOSE 304* 305* 135* 165*  167* 179*  ?BUN 65* 84* 51* 57*  58* 64*  ?CREATININE 2.17* 2.20* 1.78* 1.89*  1.98* 2.09*  ?CALCIUM 9.4  --  9.2 9.1  9.1 9.4  ?MG  --   --  2.2  --  2.4  ?PHOS  --   --  3.9 5.4* 5.6*  ? ?Estimated Creatinine Clearance: 33 mL/min (A) (by C-G formula based on SCr of 2.09 mg/dL (H)). ?Liver & Pancreas: ?Recent Labs  ?Lab 12/26/2021 ?2120 01/02/22 ?1649 01/03/22 ?7939 01/04/22 ?0448  ?AST 76*  --   --   --   ?ALT 37  --   --   --   ?ALKPHOS 115  --   --   --    ?BILITOT 0.6  --   --   --   ?PROT 6.3*  --   --   --   ?ALBUMIN 2.5* 2.6* 2.1* 2.2*  ? ?No results for input(s): LIPASE, AMYLASE in the last 168 hours. ?Recent Labs  ?Lab 01/03/22 ?1057  ?AMMONIA 17  ? ?Diabetic: ?Recent Labs

## 2022-01-05 ENCOUNTER — Inpatient Hospital Stay (HOSPITAL_COMMUNITY): Payer: No Typology Code available for payment source

## 2022-01-05 ENCOUNTER — Encounter: Payer: Self-pay | Admitting: *Deleted

## 2022-01-05 ENCOUNTER — Encounter (HOSPITAL_COMMUNITY): Payer: Self-pay | Admitting: Internal Medicine

## 2022-01-05 ENCOUNTER — Inpatient Hospital Stay
Admit: 2022-01-05 | Discharge: 2022-01-05 | Disposition: A | Discharge status: Home / Self Care | Payer: Self-pay | Attending: Oncology | Admitting: Oncology

## 2022-01-05 ENCOUNTER — Other Ambulatory Visit: Payer: Self-pay | Admitting: *Deleted

## 2022-01-05 DIAGNOSIS — N179 Acute kidney failure, unspecified: Secondary | ICD-10-CM | POA: Diagnosis not present

## 2022-01-05 DIAGNOSIS — C3490 Malignant neoplasm of unspecified part of unspecified bronchus or lung: Secondary | ICD-10-CM | POA: Diagnosis not present

## 2022-01-05 DIAGNOSIS — C3432 Malignant neoplasm of lower lobe, left bronchus or lung: Secondary | ICD-10-CM

## 2022-01-05 DIAGNOSIS — J9621 Acute and chronic respiratory failure with hypoxia: Secondary | ICD-10-CM

## 2022-01-05 DIAGNOSIS — G9340 Encephalopathy, unspecified: Secondary | ICD-10-CM | POA: Diagnosis not present

## 2022-01-05 DIAGNOSIS — R627 Adult failure to thrive: Secondary | ICD-10-CM

## 2022-01-05 DIAGNOSIS — J9601 Acute respiratory failure with hypoxia: Secondary | ICD-10-CM | POA: Diagnosis not present

## 2022-01-05 DIAGNOSIS — Z7189 Other specified counseling: Secondary | ICD-10-CM

## 2022-01-05 DIAGNOSIS — Z515 Encounter for palliative care: Secondary | ICD-10-CM

## 2022-01-05 DIAGNOSIS — J439 Emphysema, unspecified: Secondary | ICD-10-CM | POA: Diagnosis not present

## 2022-01-05 DIAGNOSIS — M545 Low back pain, unspecified: Secondary | ICD-10-CM | POA: Diagnosis not present

## 2022-01-05 DIAGNOSIS — E440 Moderate protein-calorie malnutrition: Secondary | ICD-10-CM

## 2022-01-05 DIAGNOSIS — J441 Chronic obstructive pulmonary disease with (acute) exacerbation: Secondary | ICD-10-CM

## 2022-01-05 LAB — CBC WITH DIFFERENTIAL/PLATELET
Abs Immature Granulocytes: 0.12 10*3/uL — ABNORMAL HIGH (ref 0.00–0.07)
Basophils Absolute: 0 10*3/uL (ref 0.0–0.1)
Basophils Relative: 1 %
Eosinophils Absolute: 0.1 10*3/uL (ref 0.0–0.5)
Eosinophils Relative: 2 %
HCT: 39 % (ref 39.0–52.0)
Hemoglobin: 13.1 g/dL (ref 13.0–17.0)
Immature Granulocytes: 3 %
Lymphocytes Relative: 20 %
Lymphs Abs: 0.8 10*3/uL (ref 0.7–4.0)
MCH: 29.3 pg (ref 26.0–34.0)
MCHC: 33.6 g/dL (ref 30.0–36.0)
MCV: 87.2 fL (ref 80.0–100.0)
Monocytes Absolute: 0.2 10*3/uL (ref 0.1–1.0)
Monocytes Relative: 6 %
Neutro Abs: 2.9 10*3/uL (ref 1.7–7.7)
Neutrophils Relative %: 68 %
Platelets: 59 10*3/uL — ABNORMAL LOW (ref 150–400)
RBC: 4.47 MIL/uL (ref 4.22–5.81)
RDW: 14.9 % (ref 11.5–15.5)
WBC: 4.1 10*3/uL (ref 4.0–10.5)
nRBC: 1.5 % — ABNORMAL HIGH (ref 0.0–0.2)

## 2022-01-05 LAB — RENAL FUNCTION PANEL
Albumin: 2.3 g/dL — ABNORMAL LOW (ref 3.5–5.0)
Anion gap: 9 (ref 5–15)
BUN: 58 mg/dL — ABNORMAL HIGH (ref 8–23)
CO2: 29 mmol/L (ref 22–32)
Calcium: 9.8 mg/dL (ref 8.9–10.3)
Chloride: 103 mmol/L (ref 98–111)
Creatinine, Ser: 1.85 mg/dL — ABNORMAL HIGH (ref 0.61–1.24)
GFR, Estimated: 38 mL/min — ABNORMAL LOW (ref >60)
Glucose, Bld: 97 mg/dL (ref 70–99)
Phosphorus: 4 mg/dL (ref 2.5–4.6)
Potassium: 3.9 mmol/L (ref 3.5–5.1)
Sodium: 141 mmol/L (ref 135–145)

## 2022-01-05 LAB — GLUCOSE, CAPILLARY
Glucose-Capillary: 95 mg/dL (ref 70–99)
Glucose-Capillary: 127 mg/dL — ABNORMAL HIGH (ref 70–99)

## 2022-01-05 LAB — BLOOD GAS, VENOUS
Acid-Base Excess: 8 mmol/L — ABNORMAL HIGH (ref 0.0–2.0)
Bicarbonate: 32 mmol/L — ABNORMAL HIGH (ref 20.0–28.0)
O2 Saturation: 93.1 %
Patient temperature: 36.6
pCO2, Ven: 40 mmHg — ABNORMAL LOW (ref 44–60)
pH, Ven: 7.51 — ABNORMAL HIGH (ref 7.25–7.43)
pO2, Ven: 55 mmHg — ABNORMAL HIGH (ref 32–45)

## 2022-01-05 LAB — SAVE SMEAR(SSMR), FOR PROVIDER SLIDE REVIEW

## 2022-01-05 LAB — MAGNESIUM: Magnesium: 2.3 mg/dL (ref 1.7–2.4)

## 2022-01-05 LAB — BRAIN NATRIURETIC PEPTIDE: B Natriuretic Peptide: 86 pg/mL (ref 0.0–100.0)

## 2022-01-05 MED ORDER — HALOPERIDOL LACTATE 5 MG/ML IJ SOLN: 0.5000 mg | INTRAMUSCULAR | Status: DC | PRN

## 2022-01-05 MED ORDER — SODIUM CHLORIDE 0.9 % IV SOLN
2.0000 g | INTRAVENOUS | Status: DC
  Administered 2022-01-05: 2 g via INTRAVENOUS
  Filled 2022-01-05: qty 20

## 2022-01-05 MED ORDER — LORAZEPAM 2 MG/ML IJ SOLN
1.0000 mg | INTRAMUSCULAR | Status: DC | PRN
  Administered 2022-01-05 – 2022-01-07 (×4): 1 mg via INTRAVENOUS
  Filled 2022-01-05 (×4): qty 1

## 2022-01-05 MED ORDER — GLYCOPYRROLATE 1 MG PO TABS: 1.0000 mg | ORAL_TABLET | ORAL | Status: DC | PRN

## 2022-01-05 MED ORDER — LORAZEPAM 1 MG PO TABS: 1.0000 mg | ORAL_TABLET | ORAL | Status: DC | PRN

## 2022-01-05 MED ORDER — HALOPERIDOL LACTATE 2 MG/ML PO CONC
0.5000 mg | ORAL | Status: DC | PRN
  Filled 2022-01-05: qty 0.3

## 2022-01-05 MED ORDER — METHYLPREDNISOLONE SODIUM SUCC 125 MG IJ SOLR: 60.0000 mg | Freq: Two times a day (BID) | INTRAMUSCULAR | Status: DC

## 2022-01-05 MED ORDER — ARFORMOTEROL TARTRATE 15 MCG/2ML IN NEBU
15.0000 ug | INHALATION_SOLUTION | Freq: Two times a day (BID) | RESPIRATORY_TRACT | Status: DC
  Administered 2022-01-05: 15 ug via RESPIRATORY_TRACT
  Filled 2022-01-05 (×2): qty 2

## 2022-01-05 MED ORDER — BIOTENE DRY MOUTH MT LIQD: 15.0000 mL | OROMUCOSAL | Status: DC | PRN

## 2022-01-05 MED ORDER — BUDESONIDE 0.5 MG/2ML IN SUSP
0.5000 mg | Freq: Two times a day (BID) | RESPIRATORY_TRACT | Status: DC
  Administered 2022-01-05: 0.5 mg via RESPIRATORY_TRACT
  Filled 2022-01-05: qty 2

## 2022-01-05 MED ORDER — ONDANSETRON HCL 4 MG/2ML IJ SOLN: 4.0000 mg | Freq: Four times a day (QID) | INTRAMUSCULAR | Status: DC | PRN

## 2022-01-05 MED ORDER — GLYCOPYRROLATE 0.2 MG/ML IJ SOLN: 0.2000 mg | INTRAMUSCULAR | Status: DC | PRN

## 2022-01-05 MED ORDER — SODIUM CHLORIDE 0.9 % IV SOLN
500.0000 mg | INTRAVENOUS | Status: DC
  Administered 2022-01-05: 500 mg via INTRAVENOUS
  Filled 2022-01-05: qty 5

## 2022-01-05 MED ORDER — GLYCOPYRROLATE 0.2 MG/ML IJ SOLN
0.2000 mg | INTRAMUSCULAR | Status: DC | PRN
  Administered 2022-01-05 – 2022-01-07 (×8): 0.2 mg via INTRAVENOUS
  Filled 2022-01-05 (×8): qty 1

## 2022-01-05 MED ORDER — POLYVINYL ALCOHOL 1.4 % OP SOLN
1.0000 [drp] | Freq: Four times a day (QID) | OPHTHALMIC | Status: DC | PRN
  Filled 2022-01-05: qty 15

## 2022-01-05 MED ORDER — LORAZEPAM 2 MG/ML PO CONC
1.0000 mg | ORAL | Status: DC | PRN
  Administered 2022-01-06: 1 mg via SUBLINGUAL
  Filled 2022-01-05: qty 1

## 2022-01-05 MED ORDER — HALOPERIDOL 0.5 MG PO TABS: 0.5000 mg | ORAL_TABLET | ORAL | Status: DC | PRN

## 2022-01-05 MED ORDER — ONDANSETRON 4 MG PO TBDP: 4.0000 mg | ORAL_TABLET | Freq: Four times a day (QID) | ORAL | Status: DC | PRN

## 2022-01-05 MED ORDER — HYDROMORPHONE HCL 1 MG/ML IJ SOLN
0.5000 mg | INTRAMUSCULAR | Status: DC | PRN
  Administered 2022-01-05 – 2022-01-07 (×9): 1 mg via INTRAVENOUS
  Filled 2022-01-05 (×9): qty 1

## 2022-01-05 MED ORDER — METHYLPREDNISOLONE SODIUM SUCC 125 MG IJ SOLR
125.0000 mg | Freq: Once | INTRAMUSCULAR | Status: AC
  Administered 2022-01-05: 125 mg via INTRAVENOUS
  Filled 2022-01-05: qty 2

## 2022-01-05 NOTE — Progress Notes (Signed)
?   01/05/22 0752  ?Assess: MEWS Score  ?Temp (!) 97.5 ?F (36.4 ?C)  ?BP (!) 174/80  ?Pulse Rate (!) 110  ?Resp (!) 28  ?Level of Consciousness Alert  ?SpO2 95 %  ?O2 Device Nasal Cannula  ?O2 Flow Rate (L/min) 6 L/min  ?Assess: MEWS Score  ?MEWS Temp 0  ?MEWS Systolic 0  ?MEWS Pulse 1  ?MEWS RR 2  ?MEWS LOC 0  ?MEWS Score 3  ?MEWS Score Color Yellow  ?Assess: if the MEWS score is Yellow or Red  ?Were vital signs taken at a resting state? Yes  ?Focused Assessment Change from prior assessment (see assessment flowsheet)  ?Does the patient meet 2 or more of the SIRS criteria? No  ?MEWS guidelines implemented *See Row Information* Yes  ?Treat  ?MEWS Interventions Escalated (See documentation below)  ?Pain Scale 0-10  ?Pain Score 0  ?Take Vital Signs  ?Increase Vital Sign Frequency  Yellow: Q 2hr X 2 then Q 4hr X 2, if remains yellow, continue Q 4hrs  ?Escalate  ?MEWS: Escalate Yellow: discuss with charge nurse/RN and consider discussing with provider and RRT  ?Notify: Charge Nurse/RN  ?Name of Charge Nurse/RN Notified Adline Peals, RN  ?Date Charge Nurse/RN Notified 01/05/22  ?Time Charge Nurse/RN Notified [phone]  ?Assess: SIRS CRITERIA  ?SIRS Temperature  0  ?SIRS Pulse 1  ?SIRS Respirations  1  ?SIRS WBC 0  ?SIRS Score Sum  2  ? ? ?

## 2022-01-05 NOTE — Progress Notes (Signed)
OT Cancellation Note ? ?Patient Details ?Name: Lucas Ortiz ?MRN: [id number] ?DOB: [birth year] ? ? ?Cancelled Treatment:    Reason Eval/Treat Not Completed: Medical issues which prohibited therapy ?Nurse asking for therapy to hold off at this time as patient is transferring off unit to PCU. OT to continue to follow and check back as schedule will allow.  ?Orla Estrin OTR/L, MS ?Acute Rehabilitation Department ?Office# [phone] ?Pager# [phone] ? ? ?01/05/2022, 8:42 AM ?

## 2022-01-05 NOTE — Progress Notes (Signed)
PATIENT NAVIGATOR PROGRESS NOTE ? ?Name: Lucas Ortiz ?Date: 01/05/2022 ?MRN: [id number]  ?DOB: [birth year] ? ? ?Reason for visit:  ?Introductory phone call ? ?Comments:  Introductory phone call to daughter Jonelle Sidle. Introduced myself as Leisure centre manager and provided contact information. ? ?Call placed to [facility] to obtain copy of PET scan performed there. ?Awaiting call back ? ? ? ?Time spent counseling/coordinating care: 30-45 minutes ? ?

## 2022-01-05 NOTE — TOC Progression Note (Addendum)
Transition of Care (TOC) - Progression Note  ? ? ?Patient Details  ?Name: Lucas Ortiz ?MRN: [id number] ?Date of Birth: January 11, 1951 ? ?Transition of Care (TOC) CM/SW Contact  ?[place], LCSW ?Phone Number: ?01/05/2022, 3:12 PM ? ?Clinical Narrative:    ? ?Per MD, family is in agreement with hospice at [facility] and a referral was made. CSW notified by [facility] that they will review pt for admission pending bed availability. TOC will continue to follow to assist with DC.  ? ?Expected Discharge Plan: [place] ?Barriers to Discharge: Hospice facility bed availability ? ?Expected Discharge Plan and Services ?Expected Discharge Plan: [place] ?  ?  ?  ?Living arrangements for the past 2 months: [place] ?                ?  ?  ?  ?  ?  ?  ?  ?  ?  ?  ? ? ?Social Determinants of Health (SDOH) Interventions ?  ? ?Readmission Risk Interventions ? ?  01/03/2022  ?  4:18 PM  ?Readmission Risk Prevention Plan  ?Transportation Screening Complete  ?PCP or Specialist Appt within 3-5 Days Complete  ?[place] or Home Care Consult Complete  ?Social Work Consult for [place] Planning/Counseling Complete  ?Palliative Care Screening Complete  ?Medication Review Press photographer) Complete  ? ? ?

## 2022-01-05 NOTE — Assessment & Plan Note (Signed)
Started full comfort measures after discussion with patient's daughter and grandson at bedside. ?-Palliative as needed medications ordered. ?-TOC consulted for transfer to residential hospice at [place].  Family familiar with [facility]. ?

## 2022-01-05 NOTE — Discharge Planning (Signed)
Oncology Discharge Planning Admission Note ? ?[place] at [facility] ?Address: [place], [place],  [address] ?Hours of Operation:  8am - 5pm, Monday - Friday  ?Clinic Contact Information:  [phone] ? ?Oncology Care Team: ?Medical Oncologist:   ? ?Myrtha Mantis - NP is aware of this hospital admission dated 01/02/22 and has assessed patient at bedside. The cancer center will follow [place] inpatient care to assist with discharge planning as indicated by the oncologist.  We will arrange for outpatient follow up closer to discharge if feasible.  ? ?Disclaimer:  This [place] note does not imply a formal consult request has been made by the admitting attending for this admission or there will be an inpatient consult completed by oncology.  Please request oncology consults as per standard process as indicated. ?

## 2022-01-05 NOTE — Progress Notes (Unsigned)
Spoke with [facility] and they are pushing PET and Chest CT from early March into [facility] for patient care.  ? ?Faxing report to Dr Benay Spice as well ?

## 2022-01-05 NOTE — Consult Note (Addendum)
[place]  ?[phone] [phone]  ? ?MEDICAL ONCOLOGY - INITIAL CONSULTATION ? ?Referral MD: Dr. Wendee Beavers ? ?Reason for Referral: Extensive stage small cell lung cancer ? ?HPI: Lucas Ortiz is a 71 year old male with a past medical history significant for diabetes mellitus, hypertension, COPD, PTSD.  He has been followed by the [facility] and was referred to [facility] for work-up for probable lung cancer.  He presented to the hospital due to a 2-week history of progressive weakness and poor p.o. intake.  He also ran out of pain medication and was in considerable pain.  CT renal stone study performed on admission which showed multiple left basilar pleural and parenchymal lung masses, trace left pleural effusion, enlarging intra-abdominal and retroperitoneal lymphadenopathy. ? ?Outside records reviewed through care everywhere.  He had a CT-guided core biopsy of the left lower lobe mass performed on 12/30/2021.  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 ? ?The patient was seen and his grandson was at the bedside.  Grandson provided most of the history as the patient was somewhat somnolent.  The patient is in the process of being transferred to the stepdown unit secondary to increased respiratory distress.  The patient has been getting progressively weaker over the past 2 weeks.  Cannot really walk or get out of bed at this point.  Has had a poor appetite and is losing weight.  The patient was able to tell me that he is not having any pain.  However, unable to obtain a comprehensive review of systems.  Medical oncology was asked to see the patient to make recommendations regarding his newly diagnosed extensive stage small cell lung cancer. ? ? ?Past Medical History:  ?Diagnosis Date  ? COPD (chronic obstructive pulmonary disease) ([place])   ? Hyperlipidemia   ? Hypertension   ?: ? ?History reviewed. No pertinent surgical history.: ? ? ?Current Facility-Administered  Medications  ?Medication Dose Route Frequency Provider Last Rate Last Admin  ? acetaminophen (TYLENOL) tablet 1,000 mg  1,000 mg Oral Q8H Wendee Beavers T, MD   1,000 mg at 01/05/22 4650  ? amLODipine (NORVASC) tablet 10 mg  10 mg Oral Daily Norins, Heinz Knuckles, MD   10 mg at 01/04/22 0941  ? aspirin EC tablet 81 mg  81 mg Oral Daily Neena Rhymes, MD   81 mg at 01/04/22 0941  ? atorvastatin (LIPITOR) tablet 20 mg  20 mg Oral Daily Norins, Heinz Knuckles, MD   20 mg at 01/04/22 0941  ? budesonide-formoterol (SYMBICORT) 160-4.5 MCG/ACT inhaler 2 puff  2 puff Inhalation BID Norins, Heinz Knuckles, MD   2 puff at 01/04/22 2000  ? fentaNYL (SUBLIMAZE) injection 25 mcg  25 mcg Intravenous Q2H PRN Mercy Riding, MD   25 mcg at 01/05/22 3546  ? glycopyrrolate (ROBINUL) tablet 1 mg  1 mg Oral BID PRN Mercy Riding, MD   1 mg at 01/05/22 5681  ? guaiFENesin (ROBITUSSIN) 100 MG/5ML liquid 10 mL  10 mL Oral Q4H while awake Lenis Noon, RPH   10 mL at 01/05/22 2751  ? hydrALAZINE (APRESOLINE) tablet 25 mg  25 mg Oral Q6H PRN Mercy Riding, MD      ? insulin aspart (novoLOG) injection 0-15 Units  0-15 Units Subcutaneous TID WC Norins, Heinz Knuckles, MD   3 Units at 01/04/22 2325  ? insulin glargine-yfgn (SEMGLEE) injection 10 Units  10 Units Subcutaneous Daily Mercy Riding, MD   10 Units at 01/04/22 1847  ?  ipratropium-albuterol (DUONEB) 0.5-2.5 (3) MG/3ML nebulizer solution 3 mL  3 mL Nebulization Q4H PRN Wendee Beavers T, MD      ? metoprolol tartrate (LOPRESSOR) tablet 12.5 mg  12.5 mg Oral BID Wendee Beavers T, MD   12.5 mg at 01/04/22 2143  ? oxyCODONE (Oxy IR/ROXICODONE) immediate release tablet 5 mg  5 mg Oral Q6H PRN Mercy Riding, MD   5 mg at 01/05/22 0345  ? pantoprazole (PROTONIX) EC tablet 40 mg  40 mg Oral Daily Norins, Heinz Knuckles, MD   40 mg at 01/04/22 0941  ? QUEtiapine (SEROQUEL) tablet 25 mg  25 mg Oral QHS Wendee Beavers T, MD   25 mg at 01/04/22 2143  ? senna (SENOKOT) tablet 8.6 mg  1 tablet Oral BID Neena Rhymes, MD   8.6  mg at 01/04/22 2143  ? tamsulosin (FLOMAX) capsule 0.4 mg  0.4 mg Oral Daily Norins, Heinz Knuckles, MD   0.4 mg at 01/04/22 0941  ? umeclidinium bromide (INCRUSE ELLIPTA) 62.5 MCG/ACT 1 puff  1 puff Inhalation Daily Dana Allan I, MD   1 puff at 01/04/22 0906  ? ? ? ?No Known Allergies: ? ? ?Family History  ?Problem Relation Age of Onset  ? CAD Mother   ? Diabetes Sister   ? Hypertension Sister   ?: ? ? ?Social History  ? ?Socioeconomic History  ? Marital status: Divorced  ?  Spouse name: Not on file  ? Number of children: Not on file  ? Years of education: Not on file  ? Highest education level: Not on file  ?Occupational History  ? Not on file  ?Tobacco Use  ? Smoking status: Never  ? Smokeless tobacco: Never  ?Substance and Sexual Activity  ? Alcohol use: No  ? Drug use: No  ? Sexual activity: Not on file  ?Other Topics Concern  ? Not on file  ?Social History Narrative  ? Not on file  ? ?Social Determinants of Health  ? ?Financial Resource Strain: Not on file  ?Food Insecurity: Not on file  ?Transportation Needs: Not on file  ?Physical Activity: Not on file  ?Stress: Not on file  ?Social Connections: Not on file  ?Intimate Partner Violence: Not on file  ?: ? ?Review of Systems: Unable to obtain a comprehensive review of systems.  Pertinent positives/negatives noted in the HPI. ? ?Exam: ?Patient Vitals for the past 24 hrs: ? BP Temp Temp src Pulse Resp SpO2  ?01/05/22 0752 (!) 174/80 (!) 97.5 ?F (36.4 ?C) Oral (!) 110 (!) 28 95 %  ?01/05/22 0744 -- -- -- -- -- 92 %  ?01/05/22 0513 (!) 152/70 (!) 97.3 ?F (36.3 ?C) Oral 92 18 95 %  ?01/04/22 2145 132/69 97.7 ?F (36.5 ?C) Oral 95 20 99 %  ?01/04/22 1504 136/71 98 ?F (36.7 ?C) Oral 92 -- 96 %  ?01/04/22 0908 -- -- -- -- -- 95 %  ? ? ?General: Chronically ill-appearing male, increased work of breathing ?Eyes:  no scleral icterus.   ?Lymphatics:  Negative cervical, supraclavicular or axillary adenopathy.   ?Respiratory: Coarse rhonchi throughout all of his lung  fields. ?Cardiovascular: Tachycardic ?GI:  abdomen was soft, flat, nontender, nondistended.   ?Skin: Has some scattered ecchymoses.   ?Neuro: Somnolent but able to open eyes and answer brief questions. ? ? ?Lab Results  ?Component Value Date  ? WBC 3.6 (L) 01/04/2022  ? HGB 11.9 (L) 01/04/2022  ? HCT 35.9 (L) 01/04/2022  ? PLT 47 (L) 01/04/2022  ?  GLUCOSE 179 (H) 01/04/2022  ? ALT 37 12/12/2021  ? AST 76 (H) 01/02/2022  ? NA 142 01/04/2022  ? K 3.7 01/04/2022  ? CL 104 01/04/2022  ? CREATININE 2.09 (H) 01/04/2022  ? BUN 64 (H) 01/04/2022  ? CO2 28 01/04/2022  ? ? ?DG Chest Port 1 View ? ?Result Date: 12/15/2021 ?CLINICAL DATA:  Shortness of breath. EXAM: PORTABLE CHEST 1 VIEW COMPARISON:  Chest plain film, dated June 25, 2020 and chest CT, dated December 10, 2018 30 FINDINGS: Marked severity emphysematous lung disease is seen involving the bilateral upper lobes. Lobulated soft tissue masses are seen along the left lung base and infrahilar region on the left. There is no evidence of a pleural effusion or pneumothorax. The heart size and mediastinal contours are within normal limits. The visualized skeletal structures are unremarkable. IMPRESSION: 1. Marked severity bilateral upper lobe emphysematous lung disease. 2. Lobulated soft tissue masses along the left lung base and left infrahilar region, consistent with known malignancy. Electronically Signed   By: Virgina Norfolk M.D.   On: 12/12/2021 21:45  ? ?CT Renal Stone Study ? ?Result Date: 12/20/2021 ?CLINICAL DATA:  Patient reports history of multiple malignancies not otherwise specified, weakness, anorexia, left lower quadrant pain EXAM: CT ABDOMEN AND PELVIS WITHOUT CONTRAST TECHNIQUE: Multidetector CT imaging of the abdomen and pelvis was performed following the standard protocol without IV contrast. RADIATION DOSE REDUCTION: This exam was performed according to the departmental dose-optimization program which includes automated exposure control, adjustment of  the mA and/or kV according to patient size and/or use of iterative reconstruction technique. COMPARISON:  12/09/2021 FINDINGS: Lower chest: Multiple pleural and parenchymal masses are seen within the left lower

## 2022-01-05 NOTE — Progress Notes (Signed)
Initial Nutrition Assessment ? ?DOCUMENTATION CODES:  ? ?Non-severe (moderate) malnutrition in context of chronic illness ? ?INTERVENTION:  ? ?Once diet advanced: ?-Ensure Plus High Protein po TID, each supplement provides 350 kcal and 20 grams of protein. ?-Multivitamin with minerals daily ?-Magic cup TID with meals, each supplement provides 290 kcal and 9 grams of protein   ? ? ?NUTRITION DIAGNOSIS:  ? ?Moderate Malnutrition related to chronic illness, cancer and cancer related treatments as evidenced by energy intake < or equal to 75% for > or equal to 1 month, moderate fat depletion, moderate muscle depletion. ? ?GOAL:  ? ?Patient will meet greater than or equal to 90% of their needs ? ?MONITOR:  ? ?Diet advancement, Labs, Weight trends, I & O's ? ?REASON FOR ASSESSMENT:  ? ?Consult, Malnutrition Screening Tool ?Assessment of nutrition requirement/status ? ?ASSESSMENT:  ? 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 intake and increased pain, and admitted for AKI and acute on chronic back pain. ? ?Patient in room, on BiPAP. Grandson at bedside able to provide history as pt unable to speak much.  ?Per grandson, pt has been eating poorly, mainly water, fruits, ice cream and Ensure supplements. Pt liking mainly cold items at this time.  ?PO intakes 3/27: 10% breakfast and 1 Ensure High Protein ?3/26: 15-25% of meals ? ?Currently NPO. Per RN, pt has been having some swallowing difficulties. However, when asked about this, grandson states he was not aware. Denies chewing issues.  ?Per oncology note, pt not a current candidate for chemotherapy. Will monitor for plan going forward. ? ?Per grandson, believes pt has lost ~10 lbs.  ?UBW is 180 lbs. Current weight: 174 lbs.  ? ?Medications: Senokot ? ?Labs reviewed. ? ?NUTRITION - FOCUSED PHYSICAL EXAM: ? ?Flowsheet Row Most Recent Value   ?Orbital Region Severe depletion  ?Upper Arm Region No depletion  ?Thoracic and Lumbar Region No depletion  ?Buccal Region Unable to assess  [Bipap]  ?Temple Region Unable to assess  [Bipap]  ?Clavicle Bone Region Moderate depletion  ?Clavicle and Acromion Bone Region Mild depletion  ?Scapular Bone Region Mild depletion  ?Dorsal Hand No depletion  ?Patellar Region Moderate depletion  ?Anterior Thigh Region Moderate depletion  ?Posterior Calf Region Severe depletion  ?Edema (RD Assessment) None  ?Hair Reviewed  ?Eyes Reviewed  ?Mouth Unable to assess  [Bipap]  ?Skin Reviewed  ?Nails Reviewed  ? ?  ? ? ?Diet Order:   ?Diet Order   ? ?       ?  Diet NPO time specified Except for: Sips with Meds, Ice Chips  Diet effective now       ?  ? ?  ?  ? ?  ? ? ?EDUCATION NEEDS:  ? ?No education needs have been identified at this time ? ?Skin:  Skin Assessment: Reviewed RN Assessment ? ?Last BM:  3/27 ? ?Height:  ? ?Ht Readings from Last 1 Encounters:  ?12/25/2021 5\' 10"  (1.778 m)  ? ? ?Weight:  ? ?Wt Readings from Last 1 Encounters:  ?01/02/22 79.2 kg  ? ? ?BMI:  Body mass index is 25.05 kg/m?. ? ?Estimated Nutritional Needs:  ? ?Kcal:  2200-2400 ? ?Protein:  110-120g ? ?Fluid:  2.4L/day ? ?Clayton Bibles, MS, RD, LDN ?Inpatient Clinical Dietitian ?Contact information available via Amion ? ?

## 2022-01-05 NOTE — Assessment & Plan Note (Signed)
Nutrition Status: ?Nutrition Problem: Moderate Malnutrition ?Etiology: chronic illness, cancer and cancer related treatments ?Signs/Symptoms: energy intake < or equal to 75% for > or equal to 1 month, moderate fat depletion, moderate muscle depletion ?Interventions: Refer to RD note for recommendations ?

## 2022-01-05 NOTE — Progress Notes (Signed)
Chaplain engaged in an initial visit with Nathanael, his daughter, sister, and grandson.  Chaplain provided narrative pastoral care as they shared stories and memories of Peder.  Christen's daughter talked about being a "daddy's girl" and all the ways her dad provided for her.  Chay's sister noted that Priscilla use to love collectible cars and had older model Chevrolets that were beautiful and well taken care of.  Chaplain offered reflective listening at this time as she learned about Geno's life and the ways he loved those around him. ? ?Chaplain offered prayer over Kal[place] with his family surrounding him. ? ? ? 01/05/22 1600  ?Clinical Encounter Type  ?Visited With Patient and family together  ?Visit Type Initial;Spiritual support  ?Spiritual Encounters  ?Spiritual Needs Prayer  ? ? ?

## 2022-01-05 NOTE — Assessment & Plan Note (Addendum)
Full comfort care ?

## 2022-01-05 NOTE — Progress Notes (Signed)
BLE venous duplex has been completed.  ? ?Results can be found under chart review under CV PROC. ?01/05/2022 4:21 PM ?Treson Laura RVT, RDMS ? ?

## 2022-01-05 NOTE — Progress Notes (Signed)
?PROGRESS NOTE ? ?Lucas Ortiz:[id number] DOB: [birth year]  ? ?PCP: System, Provider Not In ? ?Patient is from: Home.  Lives with daughter. ? ?DOA: 12/26/2021 LOS: 3 ? ?Chief complaints ?Chief Complaint  ?Patient presents with  ? Weakness  ?  ? 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 intake and increased pain, and admitted for AKI and acute on chronic back pain.  Initially started on IV fluid which was later discontinued.  Then started on p.o. Demadex which was also discontinued given rising creatinine.  ? ?Patient was referred to oncology group at [facility] for evaluation and treatment of his lung cancer.  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 However, patient's daughter prefers oncology follow-up here.  So in-house oncology consulted. ? ?Hospital course complicated by acute encephalopathy (likely iatrogenic) and respiratory distress likely from COPD exacerbation and possibly from underlying lung cancer.  Encephalopathy improved after adjusting meds.  He was a started on IV Solu-Medrol, IV antibiotics and nebulizers for possible COPD exacerbation no respiratory distress, and transferred to progressive care for BiPAP as needed.  PCCM consulted.  ? ?Eventually, goal of care discussion initiated.  Family decided to pursue full comfort care.  Plan for transfer to [facility] if bed available and stable for transfer.  TOC consulted. ? ? ? ?Subjective: ?Seen and examined earlier this morning.  Patient had worsening shortness of breath, work of breathing, rhonchi and increased oxygen requirement.  He is oriented x4 except months but not alert or a great historian.  ? ?Objective: ?Vitals:  ? 01/05/22 0915 01/05/22 1006  01/05/22 1042 01/05/22 1200  ?BP:  (!) 163/77 (!) 158/104   ?Pulse:  (!) 109 (!) 115   ?Resp:  20 19   ?Temp:  97.7 ?F (36.5 ?C)  (!) 97.5 ?F (36.4 ?C)  ?TempSrc:  Oral  Axillary  ?SpO2: 91% 92% 94%   ?Weight:      ?Height:      ? ? ?Examination: ? ?GENERAL: No apparent distress.  Nontoxic. ?HEENT: MMM.  Vision and hearing grossly intact.  ?NECK: Supple.  No apparent JVD.  Accessory muscle use due to increased work of breathing. ?RESP: 88% on 5 L.  Notable work of breathing and audible rhonchi.  ?CVS:  RRR. Heart sounds normal.  ?ABD/GI/GU: BS+. Abd soft, NTND.  ?MSK/EXT:  Moves extremities. No apparent deformity. No edema.  ?SKIN: no apparent skin lesion or wound ?NEURO: Awake but not alert.  Oriented x4 except months.  Follows some commands.  No apparent focal neuro deficit. ?PSYCH: Somewhat anxious from respiratory distress. ? ?Procedures:  ?None ? ?Microbiology summarized: ?None ? ?Assessment and Plan: ?Goals of care, counseling/discussion/end-of-life care/full comfort measures/DNR/DNI ?Extensive discussion about goals of care with patient's daughter and grandson at bedside.  Patient is somewhat altered to comprehend complex conversation.  Patient with what looks like stage IV lung cancer, CHF, advanced COPD with chronic respiratory failure on 4 to 5 L at baseline, CKD-3B, chronic back pain and poor quality of life.  Also progressive respiratory failure requiring BiPAP. Currently not even a candidate for chemotherapy given his poor functional status.   Family understands about his grim prognosis and poor quality of life.  Patient's daughter is clear with DNR/DNI.  Doubt chemotherapy  would reverse the course and improve patient's quality of life and prognosis.  Patient's daughter understands about this.  At this time, patient's family interested in patient's comfort and dignity.  We have discussed full comfort measures and its philosophy.  We have discussed use of medication such as IV morphine or its substitutes  and other medications to treat pain, distress and air hunger.  They understand that those medications could speed up the dying process but makes it peaceful.  I have also discussed about residential hospice.  [facility] is [facility]. They are open for transfer to [facility] if stable for transfer. ?-Full comfort measures initiated with as needed meds. ?-Patient's RN and other consultants notified. ?-Visitation restrictions will be lifted. ?-TOC consulted for referral to be completed. ? ?Acute encephalopathy ?Some improvement after discontinuing scheduled opiate and reducing Seroquel.  ? ?Acute renal failure superimposed on stage 3b chronic kidney disease ([place]) ?AKI resolved. ? ?Extensive stage small cell lung cancer ?Recent diagnosis at [place]. Per oncology fellow at [facility], Dr. Jason Fila, biopsy confirmed small cell lung cancer.  CT and CXR reveal multiple lung masses and adenopathy.  In-house oncology consulted per request by family.  Per oncology, extensive stage smoldering cancer.  Currently not a candidate for systemic treatment due to poor performance status.  He has a lot of comorbidities as well. ?-Comfort care initiated ? ?COPD with acute exacerbation ([place]) ?Full comfort care ? ?Acute on chronic respiratory failure with hypoxia (HCC) ?Full comfort care ? ?Pancytopenia ([place]) ? ?NSVT (nonsustained ventricular tachycardia) ? ?Acute on chronic back pain ? ?Dyspepsia ? ?PTSD (post-traumatic stress disorder) ? ?DM2 (diabetes mellitus, type 2) ([place]) ? ?Malnutrition of moderate degree ? ?HTN (hypertension) ? ?CKD (chronic kidney disease), stage III ([place]) ?See AKI ? ?Hyperlipidemia ? ? ?DVT prophylaxis:  ?  Patient is full comfort care. ? ?Code Status: Full code ?Family Communication: Updated patient's daughter and grandson at bedside. ?Level of care: Med-Surg ?Status is: Inpatient ?Remains inpatient appropriate because: Residential hospice ? ? ?Final disposition: Transfer to [facility] ? ?Consultants:   ?Oncology ?Pulmonology ? ?Sch Meds:  ?Scheduled Meds: ? ? ?Continuous Infusions: ? ? ?PRN Meds:.antiseptic oral rinse, glycopyrrolate **OR** glycopyrrolate **OR** glycopyrrolate, haloperidol **OR** haloperidol **OR** haloperidol lactate, HYDROmorphone (DILAUDID) injection, LORazepam **OR** LORazepam **OR** LORazepam, ondansetron **OR** ondansetron (ZOFRAN) IV, polyvinyl alcohol ? ?Antimicrobials: ?Anti-infectives (From admission, onward)  ? ? Start     Dose/Rate Route Frequency Ordered Stop  ? 01/05/22 0930  cefTRIAXone (ROCEPHIN) 2 g in sodium chloride 0.9 % 100 mL IVPB  Status:  Discontinued       ? 2 g ?200 mL/hr over 30 Minutes Intravenous Every 24 hours 01/05/22 0810 01/05/22 1429  ? 01/05/22 0900  azithromycin (ZITHROMAX) 500 mg in sodium chloride 0.9 % 250 mL IVPB  Status:  Discontinued       ? 500 mg ?250 mL/hr over 60 Minutes Intravenous Every 24 hours 01/05/22 0810 01/05/22 1429  ? ?  ? ? ? ?I have personally reviewed the following labs and images: ?CBC: ?Recent Labs  ?Lab 01/04/2022 ?2120 01/06/2022 ?2133 01/02/22 ?1649 01/03/22 ?9518 01/04/22 ?0448 01/05/22 ?0740  ?WBC 8.2  --  9.6 5.7 3.6* 4.1  ?NEUTROABS 6.2  --  6.3 3.8  --  2.9  ?HGB 13.5 12.6* 13.7 11.9* 11.9* 13.1  ?HCT 39.4 37.0* 41.6 35.9* 35.9* 39.0  ?MCV 85.1  --  90.0 88.2 88.2 87.2  ?PLT 67*  --  63* 49* 47* 59*  ? ?BMP &GFR ?  Recent Labs  ?Lab 12/17/2021 ?2120 01/06/2022 ?2133 01/02/22 ?1649 01/03/22 ?7106 01/04/22 ?0448 01/05/22 ?0740  ?NA 134* 133* 137 139  139 142 141  ?K 3.5 5.6* 4.1 3.6  3.6 3.7 3.9  ?CL 95* 95* 100 100  101 104 103  ?CO2 28  --  28 28  28 28 29   ?GLUCOSE 304* 305* 135* 165*  167* 179* 97  ?BUN 65* 84* 51* 57*  58* 64* 58*  ?CREATININE 2.17* 2.20* 1.78* 1.89*  1.98* 2.09* 1.85*  ?CALCIUM 9.4  --  9.2 9.1  9.1 9.4 9.8  ?MG  --   --  2.2  --  2.4 2.3  ?PHOS  --   --  3.9 5.4* 5.6* 4.0  ? ?Estimated Creatinine Clearance: 37.3 mL/min (A) (by C-G formula based on SCr of 1.85 mg/dL (H)). ?Liver & Pancreas: ?Recent Labs  ?Lab  01/02/2022 ?2120 01/02/22 ?1649 01/03/22 ?2694 01/04/22 ?0448 01/05/22 ?0740  ?AST 76*  --   --   --   --   ?ALT 37  --   --   --   --   ?ALKPHOS 115  --   --   --   --   ?BILITOT 0.6  --   --   --   --   ?PRO

## 2022-01-05 NOTE — Assessment & Plan Note (Signed)
Extensive discussion about goals of care with patient's daughter and grandson at bedside.  Patient is somewhat altered to comprehend complex conversation.  Patient with what looks like stage IV lung cancer, CHF, advanced COPD with chronic respiratory failure on 4 to 5 L at baseline, CKD-3B, chronic back pain and poor quality of life.  Also progressive respiratory failure requiring BiPAP. Currently not even a candidate for chemotherapy given his poor functional status.   Family understands about his grim prognosis and poor quality of life.  Patient's daughter is clear with DNR/DNI.  Doubt chemotherapy would reverse the course and improve patient's quality of life and prognosis.  Patient's daughter understands about this.  At this time, patient's family interested in patient's comfort and dignity.  We have discussed full comfort measures and its philosophy.  We have discussed use of medication such as IV morphine or its substitutes and other medications to treat pain, distress and air hunger.  They understand that those medications could speed up the dying process but makes it peaceful.  I have also discussed about residential hospice.  [facility] is [facility]. They are open for transfer to [facility] if stable for transfer. ?-Full comfort measures initiated ?-Patient's RN and other consultants notified. ?-Visitation restrictions will be lifted. ?

## 2022-01-05 NOTE — Assessment & Plan Note (Signed)
Full comfort care ?

## 2022-01-05 NOTE — Progress Notes (Signed)
?PROGRESS NOTE ? ?Lucas Ortiz ZHG:[id number] DOB: October 22, 1950  ? ?PCP: System, Provider Not In ? ?Patient is from: Home.  Lives with daughter. ? ?DOA: 12/25/2021 LOS: 3 ? ?Chief complaints ?Chief Complaint  ?Patient presents with  ? Weakness  ?  ? 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 intake and increased pain, and admitted for AKI and acute on chronic back pain.  Initially started on IV fluid which was later discontinued.  Then started on p.o. Demadex which was also discontinued given rising creatinine.  ? ?Patient was referred to oncology group at [facility] for evaluation and treatment of his lung cancer.  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 However, patient's daughter prefers oncology follow-up here.  So in-house oncology consulted. ? ?Hospital course complicated by acute encephalopathy (likely iatrogenic) and respiratory distress likely from COPD exacerbation and possibly from underlying lung cancer.  Encephalopathy improved after adjusting meds.  He was a started on IV Solu-Medrol, IV antibiotics and nebulizers for possible COPD exacerbation no respiratory distress, and transferred to progressive care for BiPAP as needed.  PCCM consulted.  ? ? ? ?Subjective: ?Seen and examined earlier this morning.  Patient had worsening shortness of breath, work of breathing, rhonchi and increased oxygen requirement.  He is oriented x4 except months but not alert or a great historian.  ? ?Objective: ?Vitals:  ? 01/05/22 0915 01/05/22 1006 01/05/22 1042 01/05/22 1200  ?BP:  (!) 163/77 (!) 158/104   ?Pulse:  (!) 109 (!) 115   ?Resp:  20 19   ?Temp:  97.7 ?F (36.5 ?C)  (!) 97.5 ?F (36.4 ?C)  ?TempSrc:  Oral  Axillary  ?SpO2:  91% 92% 94%   ?Weight:      ?Height:      ? ? ?Examination: ? ?GENERAL: No apparent distress.  Nontoxic. ?HEENT: MMM.  Vision and hearing grossly intact.  ?NECK: Supple.  No apparent JVD.  Accessory muscle use due to increased work of breathing. ?RESP: 88% on 5 L.  Notable work of breathing and audible rhonchi.  ?CVS:  RRR. Heart sounds normal.  ?ABD/GI/GU: BS+. Abd soft, NTND.  ?MSK/EXT:  Moves extremities. No apparent deformity. No edema.  ?SKIN: no apparent skin lesion or wound ?NEURO: Awake but not alert.  Oriented x4 except months.  Follows some commands.  No apparent focal neuro deficit. ?PSYCH: Somewhat anxious from respiratory distress. ? ?Procedures:  ?None ? ?Microbiology summarized: ?None ? ?Assessment and Plan: ?* Acute renal failure superimposed on stage 3b chronic kidney disease ([place]) ?Recent Labs  ?  12/10/2021 ?2120 12/30/2021 ?2133 01/02/22 ?1649 01/03/22 ?1962 01/04/22 ?0448 01/05/22 ?0740  ?BUN 65* 84* 51* 57*  58* 64* 58*  ?CREATININE 2.17* 2.20* 1.78* 1.89*  1.98* 2.09* 1.85*  ?AKI seems to have resolved.  Baseline creatinine 1.7-1.9? ?-Continue holding diuretics. ?-Strict intake and output ?-Recheck renal function in the morning ? ?COPD with acute exacerbation ([place]) ?Patient with worsening dyspnea, increased work of breathing, rhonchi and oxygen requirement.  Portable chest x-ray raises concern for increased left mid and lower lung opacities which could reflect pneumonia with dense and consolidation or collapse of the LLL.   This does not look different from his prior chest x-ray. VBG suggests respiratory alkalosis.  BNP within  normal.  He also had multiple left basilar pleural and parenchymal lung masses from malignancy.  Difficult to rule out PE but not sure if he can be anticoagulated safely given his thrombocytopenia.  ?-Transfer to progressive care ?-BiPAP as needed for respiratory distress ?-Start IV Solu-Medrol, ceftriaxone and azithromycin ?-Add Brovana and Pulmicort..  Continue  DuoNebs ?-Continue Robinul ?-Decrease metoprolol. ?-Check lower extremity venous Doppler. ? ?Acute encephalopathy ?Likely iatrogenic from scheduled opiates and high-dose Seroquel. VBG, ammonia, RPR and B12 unrevealing.  No focal neurodeficit to suggest CVA.  He is oriented x4 except months but not alert. ?-Minimum oxygen to keep saturation above 88% despite home 5 L.  ?-BiPAP as needed ?-Discontinued scheduled oxycodone.  Not on this at home per narcotic database. ?-Reduce Seroquel to 25 mg at night.  Not on Seroquel at home. ?-Reorientation and delirium precautions. ?-Fall and aspiration precaution ? ?Lung cancer ([place]) ?Recent diagnosis at [place]. Per oncology fellow at [facility], Dr. Jason Fila, biopsy confirmed small cell lung cancer.  CT and CXR reveal multiple lung masses and adenopathy.  ?-Dr. Gwynneth Macleod planning direct admission to initiate chemotherapy but daughter prefers oncology care at [facility].  ?-Appreciate input by oncology-not a candidate for chemotherapy at the moment due to performance status ? ?Acute on chronic respiratory failure with hypoxia (HCC) ?Likely due to COPD exacerbation and underlying lung malignancy. ?-See COPD for management ? ?Pancytopenia ([place]) ?Resolved except for thrombocytopenia which has also improved. ?-Appreciate input by oncology ? ?NSVT (nonsustained ventricular tachycardia) ?Reportedly had about 20 runs of VT the night of 3/26. ?-Continue low-dose metoprolol ?-Optimize electrolytes ?-Discontinue Cymbalta and decrease Seroquel. ?-Continue telemetry monitoring ? ?Back pain ?Patient with DJD disease and chronic back pain.  CT renal stone study without acute osseous finding.  Seems to be on oxycodone IR 5 mg every 4 hours as needed per narcotic database.  He is not on scheduled OxyContin at home per narcotic database. ?-Discontinue scheduled OxyContin given encephalopathy. ?-Continue Oxy IR 5 mg every 4 hours as needed moderate pain ?-IV fentanyl as needed for severe and breakthrough  pain ? ?Dyspepsia ?Does not seem to be on medication for this at home. ?-Continue p.o. Protonix for now ? ?PTSD (post-traumatic stress disorder) ?Followed at [facility]. ?-Discontinue Cymbalta.  Not on Cymbalta at home per pharmacy ? ?DM2 (diabetes mellitus, type 2) ([place]) ?Uncontrolled NIDDM-2 with hyperglycemia.  A1c 8.6%. ?Recent Labs  ?Lab 01/04/22 ?1250 01/04/22 ?1749 01/04/22 ?2116 01/05/22 ?3335 01/05/22 ?1123  ?GLUCAP 251* 157* 169* 95 127*  ?-Continue SSI-moderate ?-Hold Semglee at this morning. ?-Continue home Lipitor ? ?Malnutrition of moderate degree ?Nutrition Status: ?Nutrition Problem: Moderate Malnutrition ?Etiology: chronic illness, cancer and cancer related treatments ?Signs/Symptoms: energy intake < or equal to 75% for > or equal to 1 month, moderate fat depletion, moderate muscle depletion ?Interventions: Refer to RD note for recommendations ? ?HTN (hypertension) ?BP within acceptable range. ?-Continue amlodipine ?-Decrease metoprolol given COPD.  Not on metoprolol at home. ?-Change labetalol to p.o. hydralazine as needed given COPD. ? ?CKD (chronic kidney disease), stage III ([place]) ?See AKI ? ?Hyperlipidemia ?Continue home medication ? ? ?DVT prophylaxis:  ? ? ?Code Status: Full code ?Family Communication: Will update family ?Level of care: Med-Surg ?Status is: Inpatient ?Remains inpatient appropriate because:  ? ? ?Final disposition: TBD. ? ?Consultants:  ?Oncology ? ?Sch Meds:  ?Scheduled Meds: ? ? ?Continuous Infusions: ? ? ?PRN Meds:.antiseptic oral rinse, glycopyrrolate **OR** glycopyrrolate **OR** glycopyrrolate, haloperidol **OR** haloperidol **OR** haloperidol lactate, HYDROmorphone (DILAUDID) injection, LORazepam **OR** LORazepam **OR** LORazepam, ondansetron **  OR** ondansetron (ZOFRAN) IV, polyvinyl alcohol ? ?Antimicrobials: ?Anti-infectives (From admission, onward)  ? ? Start     Dose/Rate Route Frequency Ordered Stop  ? 01/05/22 0930  cefTRIAXone (ROCEPHIN) 2 g in sodium chloride 0.9 % 100 mL  IVPB  Status:  Discontinued       ? 2 g ?200 mL/hr over 30 Minutes Intravenous Every 24 hours 01/05/22 0810 01/05/22 1429  ? 01/05/22 0900  azithromycin (ZITHROMAX) 500 mg in sodium chloride 0.9 % 250 mL IVPB  Status:  [place]

## 2022-01-06 DIAGNOSIS — G9340 Encephalopathy, unspecified: Secondary | ICD-10-CM | POA: Diagnosis not present

## 2022-01-06 DIAGNOSIS — M545 Low back pain, unspecified: Secondary | ICD-10-CM | POA: Diagnosis not present

## 2022-01-06 DIAGNOSIS — N179 Acute kidney failure, unspecified: Secondary | ICD-10-CM | POA: Diagnosis not present

## 2022-01-06 DIAGNOSIS — J439 Emphysema, unspecified: Secondary | ICD-10-CM | POA: Diagnosis not present

## 2022-01-06 NOTE — Progress Notes (Signed)
[facility] [address] [facility]) Hospital Liaison note: ? ?Spoke to patient's daughter Jonelle Sidle to update. ?Unfortunately, [facility] is not able to offer a room today. TOC aware hospital liaison will follow up tomorrow if room becomes available. ? ?Please call with any hospice related questions or concerns. ?  ?Thank you, ?Lorelee Market, LPN ?[facility] Hospital Liaison ?[phone] ?

## 2022-01-06 NOTE — Progress Notes (Addendum)
[facility]) Hospital Liaison ? ?Referral received from Dr. Cyndia Skeeters and [facility] Drue Stager for family interest in [facility]. Met with daughter Jonelle Sidle to acknowledge referral.  ? ?Chart reviewed and hospice eligibility confirmed. ? ?Unfortunately, [place] is not able to offer a room today. Family and TOC aware hospital liaison will follow up tomorrow if room becomes available. ? ?Please call with any hospice related questions or concerns. ? ?Thank you. ?Margaretmary Eddy, BSN, RN ?[facility] Liaison ?[phone] ?

## 2022-01-06 NOTE — Progress Notes (Signed)
Removed O2 per family request.  ?

## 2022-01-06 NOTE — Progress Notes (Signed)
?PROGRESS NOTE ? ?Lucas Ortiz GUR:[id number] DOB: [birth year]  ? ?PCP: System, Provider Not In ? ?Patient is from: Home.  Lives with daughter. ? ?DOA: 12/28/2021 LOS: 4 ? ?Chief complaints ?Chief Complaint  ?Patient presents with  ? Weakness  ?  ? 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 intake and increased pain, and admitted for AKI and acute on chronic back pain.  Initially started on IV fluid which was later discontinued.  Then started on p.o. Demadex which was also discontinued given rising creatinine.  ? ?Patient was referred to oncology group at [facility] for evaluation and treatment of his lung cancer.  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 However, patient's daughter prefers oncology follow-up here.  So in-house oncology consulted. ? ?Hospital course complicated by acute encephalopathy (likely iatrogenic) and respiratory distress likely from COPD exacerbation and possibly from underlying lung cancer.  Encephalopathy improved after adjusting meds.  He was a started on IV Solu-Medrol, IV antibiotics and nebulizers for possible COPD exacerbation no respiratory distress, and transferred to progressive care for BiPAP as needed.  PCCM consulted.  ? ?Eventually, goal of care discussion initiated.  Family decided to pursue full comfort care.  Plan for transfer to [facility] if bed available and stable for transfer.  TOC consulted. ? ? ? ?Subjective: ?Seen and examined earlier this morning and later this afternoon.  Grandson, patient's sister and other family members at bedside.  No concerns.  Seems to be comfortable ? ?Objective: ?Vitals:  ? 01/05/22 1400 01/05/22 1500 01/05/22 1600 01/06/22 0537  ?BP: (!) 189/93  (!) 195/84 (!) 166/74 (!) 204/87  ?Pulse: (!) 110 (!) 107 (!) 123 (!) 111  ?Resp: 19 18  18   ?Temp:    97.6 ?F (36.4 ?C)  ?TempSrc:    Oral  ?SpO2: 94% 96% 93% (!) 89%  ?Weight:      ?Height:      ? ? ?Examination: ? ?GENERAL: No apparent distress.  ?RESP: Mild work of breathing.  Rhonchi. ?MSK/EXT: No apparent deformity. ?SKIN: no apparent skin lesion or wound ?PSYCH: Calm.  No distress or agitation. ? ?Procedures:  ?None ? ?Microbiology summarized: ?None ? ?Assessment and Plan: ?End-of-life care/full comfort measures/goal of care discussion/DNR/DNI ?-Palliative pathway with as needed meds-Dilaudid, Ativan, glycopyrrolate, Zofran.. ?-Waiting on bed at [facility]. ?-Emotional support for family members ? ?Acute encephalopathy ? ?Acute renal failure superimposed on stage 3b chronic kidney disease ([place]) ? ?Extensive stage small cell lung cancer ? ?COPD with acute exacerbation ([place]) ? ?Acute on chronic respiratory failure with hypoxia (HCC) ? ?Pancytopenia ([place]) ? ?NSVT (nonsustained ventricular tachycardia) ? ?Acute on chronic back pain ? ?Dyspepsia ? ?PTSD (post-traumatic stress disorder) ? ?DM2 (diabetes mellitus, type 2) ([place]) ? ?Malnutrition of moderate degree ? ?HTN (hypertension) ? ?CKD (chronic kidney disease), stage III ([place]) ?See AKI ? ?Hyperlipidemia ? ? ?DVT prophylaxis:  ?Patient is full comfort care. ? ?Code Status: Full code ?Family Communication: Updated patient's grandson, sister and other family members at bedside. ?Level of care: Med-Surg ?Status is: Inpatient ?Remains inpatient appropriate because: [facility]idential hosp[facility] ? ? ?Final disposition: Transfer to [facility] when bed available ? ?Consultants:  ?Oncology ?Pulmonology ? ?Sch Meds:  ?Scheduled Meds: ? ? ?Continuous Infusions: ? ? ?PRN  Meds:.antiseptic oral rinse, glycopyrrolate **OR** glycopyrrolate **OR** glycopyrrolate, haloperidol **OR** haloperidol **OR** haloperidol lactate, HYDROmorphone (DILAUDID) injection, LORazepam  **OR** LORazepam **OR** LORazepam, ondansetron **OR** ondansetron (ZOFRAN) IV, polyvinyl alcohol ? ?Antimicrobials: ?Anti-infectives (From admission, onward)  ? ? Start     Dose/Rate Route Frequency Ordered Stop  ? 01/05/22 0930  cefTRIAXone (ROCEPHIN) 2 g in sodium chloride 0.9 % 100 mL IVPB  Status:  Discontinued       ? 2 g ?200 mL/hr over 30 Minutes Intravenous Every 24 hours 01/05/22 0810 01/05/22 1429  ? 01/05/22 0900  azithromycin (ZITHROMAX) 500 mg in sodium chloride 0.9 % 250 mL IVPB  Status:  Discontinued       ? 500 mg ?250 mL/hr over 60 Minutes Intravenous Every 24 hours 01/05/22 0810 01/05/22 1429  ? ?  ? ? ? ?I have personally reviewed the following labs and images: ?CBC: ?Recent Labs  ?Lab 12/15/2021 ?2120 12/10/2021 ?2133 01/02/22 ?1649 01/03/22 ?3614 01/04/22 ?0448 01/05/22 ?0740  ?WBC 8.2  --  9.6 5.7 3.6* 4.1  ?NEUTROABS 6.2  --  6.3 3.8  --  2.9  ?HGB 13.5 12.6* 13.7 11.9* 11.9* 13.1  ?HCT 39.4 37.0* 41.6 35.9* 35.9* 39.0  ?MCV 85.1  --  90.0 88.2 88.2 87.2  ?PLT 67*  --  63* 49* 47* 59*  ? ?BMP &GFR ?Recent Labs  ?Lab 12/26/2021 ?2120 01/05/2022 ?2133 01/02/22 ?1649 01/03/22 ?4315 01/04/22 ?0448 01/05/22 ?0740  ?NA 134* 133* 137 139  139 142 141  ?K 3.5 5.6* 4.1 3.6  3.6 3.7 3.9  ?CL 95* 95* 100 100  101 104 103  ?CO2 28  --  28 28  28 28 29   ?GLUCOSE 304* 305* 135* 165*  167* 179* 97  ?BUN 65* 84* 51* 57*  58* 64* 58*  ?CREATININE 2.17* 2.20* 1.78* 1.89*  1.98* 2.09* 1.85*  ?CALCIUM 9.4  --  9.2 9.1  9.1 9.4 9.8  ?MG  --   --  2.2  --  2.4 2.3  ?PHOS  --   --  3.9 5.4* 5.6* 4.0  ? ?Estimated Creatinine Clearance: 37.3 mL/min (A) (by C-G formula based on SCr of 1.85 mg/dL (H)). ?Liver & Pancreas: ?Recent Labs  ?Lab 01/05/2022 ?2120 01/02/22 ?1649 01/03/22 ?4008 01/04/22 ?0448 01/05/22 ?0740  ?AST 76*  --   --   --   --   ?ALT 37  --   --   --   --   ?ALKPHOS 115  --   --   --   --   ?BILITOT 0.6  --   --   --   --   ?PROT 6.3*  --   --   --   --   ?ALBUMIN 2.5* 2.6* 2.1* 2.2* 2.3*  ? ?No results  for input(s): LIPASE, AMYLASE in the last 168 hours. ?Recent Labs  ?Lab 01/03/22 ?1057  ?AMMONIA 17  ? ?Diabetic: ?No results for input(s): HGBA1C in the last 72 hours. ? ?Recent Labs  ?Lab 01/04/22 ?1250 01/04/22 ?1749 01/04/22 ?2116 01/05/22 ?6761 01/05/22 ?1123  ?GLUCAP 251* 157* 169* 95 127*  ? ?Cardiac Enzymes: ?No results for input(s): CKTOTAL, CKMB, CKMBINDEX, TROPONINI in the last 168 hours. ?No results for input(s): PROBNP in the last 8760 hours. ?Coagulation Profile: ?No results for input(s): INR, PROTIME in the last 168 hours. ?Thyroid Function Tests: ?No results for input(s): TSH, T4TOTAL, FREET4, T3FREE, THYROIDAB in the last 72 hours. ? ?Lipid Profile: ?No results for input(s): CHOL, HDL, LDLCALC, TRIG, CHOLHDL,  LDLDIRECT in the last 72 hours. ?Anemia Panel: ?No results for input(s): VITAMINB12, FOLATE, FERRITIN, TIBC, IRON, RETICCTPCT in the last 72 hours. ? ?Urine analysis: ?   ?Component Value Date/Time  ? COLORURINE YELLOW 12/22/2021 2125  ? APPEARANCEUR CLEAR 01/06/2022 2125  ? LABSPEC 1.018 12/28/2021 2125  ? PHURINE 5.0 12/11/2021 2125  ? GLUCOSEU >=500 (A) 12/27/2021 2125  ? HGBUR SMALL (A) 01/02/2022 2125  ? [place] NEGATIVE 12/31/2021 2125  ? [place] NEGATIVE 01/01/2022 2125  ? PROTEINUR >=300 (A) 12/31/2021 2125  ? NITRITE NEGATIVE 12/21/2021 2125  ? LEUKOCYTESUR NEGATIVE 12/11/2021 2125  ? ?Sepsis Labs: ?Invalid input(s): PROCALCITONIN, LACTICIDVEN ? ?Microbiology: ?No results found for this or any previous visit (from the past 240 hour(s)). ? ?Radiology Studies: ?VAS [place] LOWER EXTREMITY VENOUS (DVT) ? ?Result Date: 01/05/2022 ? Lower Venous DVT Study Patient Name:  HAYWARD RYLANDER  Date of Exam:   01/05/2022 Medical Rec #: [id number]         Accession #:    [id number] Date of Birth: December 11, 1950         Patient Gender: M Patient Age:   76 years Exam Location:  [facility] Procedure:      VAS [place] LOWER EXTREMITY VENOUS (DVT) Referring Phys: Bretta Bang Neithan Day  --------------------------------------------------------------------------------  Indications: Hypoxia.  Risk Factors: Cancer. Comparison Study: No previous exams Performing Technologist: Jody Hill RVT, RDMS  Examination Guidelines: A complete ev

## 2022-01-07 DIAGNOSIS — C3432 Malignant neoplasm of lower lobe, left bronchus or lung: Secondary | ICD-10-CM | POA: Diagnosis not present

## 2022-01-07 DIAGNOSIS — N179 Acute kidney failure, unspecified: Secondary | ICD-10-CM | POA: Diagnosis not present

## 2022-01-07 DIAGNOSIS — F4310 Post-traumatic stress disorder, unspecified: Secondary | ICD-10-CM

## 2022-01-07 DIAGNOSIS — G9340 Encephalopathy, unspecified: Secondary | ICD-10-CM | POA: Diagnosis not present

## 2022-01-07 DIAGNOSIS — J9621 Acute and chronic respiratory failure with hypoxia: Secondary | ICD-10-CM | POA: Diagnosis not present

## 2022-01-07 MED ORDER — SCOPOLAMINE 1 MG/3DAYS TD PT72: 1.0000 | MEDICATED_PATCH | TRANSDERMAL | Status: DC

## 2022-01-09 NOTE — Death Summary Note (Signed)
? ?DEATH SUMMARY  ? ?Patient Details  ?Name: Lucas Ortiz ?MRN: [id number] ?DOB: 1951-07-02 ?BZJ:IRCVEL, Provider Not In ?Admission/Discharge Information  ? ?Admit Date:  January 20, 2022  ?Date of Death: Date of Death: 26-Jan-2022  ?Time of Death: Time of Death: 0812  ?Length of Stay: 5  ? ?Principle Cause of death: Small cell lung cancer ? ?Hospital Diagnoses: ?Principal Problem: ?  Extensive stage small cell lung cancer ?Active Problems: ?  End of life care ?  Goals of care, counseling/discussion ?  Acute on chronic respiratory failure with hypoxia (HCC) ?  COPD with acute exacerbation ([place]) ?  Acute renal failure superimposed on stage 3b chronic kidney disease ([place]) ?  Acute encephalopathy ?  DM2 (diabetes mellitus, type 2) ([place]) ?  PTSD (post-traumatic stress disorder) ?  Dyspepsia ?  Back pain ?  NSVT (nonsustained ventricular tachycardia) ?  Pancytopenia ([place]) ?  Hyperlipidemia ?  CKD (chronic kidney disease), stage III ([place]) ?  HTN (hypertension) ?  FTT (failure to thrive) in adult ?  Malnutrition of moderate degree ? ? 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 intake and increased pain, and admitted for AKI and acute on chronic back pain.  Initially started on IV fluid which was later discontinued.  Then started on p.o. Demadex which was also discontinued given rising creatinine.  ? ?Patient was referred to oncology group at [facility] for evaluation and treatment of his lung cancer.  Per oncology fellow at [facility], Dr Virl Cagey his biopsy result confirmed small cell lung cancer, and they are thinking about direct admit to initiate chemotherapy in-house. However, patient's daughter prefers oncology follow-up here.  So in-house oncology consulted. ? ?Hospital course complicated by acute encephalopathy (likely iatrogenic) and respiratory distress likely from COPD  exacerbation and possibly from underlying lung cancer.  Encephalopathy improved after adjusting meds.  He was a started on IV Solu-Medrol, IV antibiotics and nebulizers for possible COPD exacerbation no respiratory distress, and transferred to progressive care for BiPAP as needed.  PCCM consulted.  ? ?Eventually, goal of care discussion initiated.  Family decided to pursue full comfort care on 01/05/2022.  Initially, plan was for transfer to [facility] if bed available and stable for transfer.  However, patient declined and passed away on Jan 26, 2022 at 8:12 AM with family members at bedside. ? ? ? ? ?Assessment and Plan: ?End-of-life care/full comfort measures/goal of care discussion/DNR/DNI ?  ?Acute encephalopathy ?  ?Acute renal failure superimposed on stage 3b chronic kidney disease ([place]) ?  ?Extensive stage small cell lung cancer ?  ?COPD with acute exacerbation ([place]) ?  ?Acute on chronic respiratory failure with hypoxia (HCC) ?  ?Pancytopenia ([place]) ?  ?NSVT (nonsustained ventricular tachycardia) ?  ?Acute on chronic back pain ?  ?Dyspepsia ?  ?PTSD (post-traumatic stress disorder) ?  ?DM2 (diabetes mellitus, type 2) ([place]) ?  ?Malnutrition of moderate degree ?  ?HTN (hypertension) ?  ?Hyperlipidemia ? ? ?  ? ? ?Procedures: None ? ?Consultations: Oncology ? ?The results of significant diagnostics from this hospitalization (including imaging, microbiology, ancillary and laboratory) are listed below for reference.  ? ?Significant Diagnostic Studies: ?DG Chest Port 1 View ? ?Result Date: 01/05/2022 ?CLINICAL DATA:  Weakness and cough. EXAM: PORTABLE CHEST 1 VIEW COMPARISON:  Chest radiographs 20-Jan-2022 FINDINGS: The cardiomediastinal silhouette is unchanged with normal heart size. Severe emphysema is again noted. Multiple masses are  again noted in the left mid and lower lung, and there is increased surrounding interstitial and airspace opacity including denser opacity in the retrocardiac region. No large pleural  effusion or pneumothorax is identified. IMPRESSION: 1. Increased left mid and lower lung opacities which could reflect pneumonia with denser consolidation or collapse in the left lower lobe. 2. Severe emphysema and known left lung masses. Electronically Signed   By: Logan Bores M.D.   On: 01/05/2022 08:27  ? ?DG Chest Port 1 View ? ?Result Date: 01/08/2022 ?CLINICAL DATA:  Shortness of breath. EXAM: PORTABLE CHEST 1 VIEW COMPARISON:  Chest plain film, dated June 25, 2020 and chest CT, dated December 10, 2018 30 FINDINGS: Marked severity emphysematous lung disease is seen involving the bilateral upper lobes. Lobulated soft tissue masses are seen along the left lung base and infrahilar region on the left. There is no evidence of a pleural effusion or pneumothorax. The heart size and mediastinal contours are within normal limits. The visualized skeletal structures are unremarkable. IMPRESSION: 1. Marked severity bilateral upper lobe emphysematous lung disease. 2. Lobulated soft tissue masses along the left lung base and left infrahilar region, consistent with known malignancy. Electronically Signed   By: Virgina Norfolk M.D.   On: 12/25/2021 21:45  ? ?CT Renal Stone Study ? ?Result Date: 12/15/2021 ?CLINICAL DATA:  Patient reports history of multiple malignancies not otherwise specified, weakness, anorexia, left lower quadrant pain EXAM: CT ABDOMEN AND PELVIS WITHOUT CONTRAST TECHNIQUE: Multidetector CT imaging of the abdomen and pelvis was performed following the standard protocol without IV contrast. RADIATION DOSE REDUCTION: This exam was performed according to the departmental dose-optimization program which includes automated exposure control, adjustment of the mA and/or kV according to patient size and/or use of iterative reconstruction technique. COMPARISON:  12/09/2021 FINDINGS: Lower chest: Multiple pleural and parenchymal masses are seen within the left lower lobe, largest measuring 5.4 x 4.2 cm reference  image 8/2, consistent with known history of malignancy. Little change since prior CT exam. Trace left pleural effusion. Hepatobiliary: Unremarkable unenhanced appearance of the liver and gallbladder. Pancreas: Unremarkable unenhanced appearance. Spleen: Unremarkable unenhanced appearance. Adrenals/Urinary Tract: The adrenals are unremarkable. No urinary tract calculi or obstructive uropathy within either kidney. The bladder is moderately distended without focal abnormality. Stomach/Bowel: No bowel obstruction or ileus. Normal appendix right lower quadrant. No bowel wall thickening or inflammatory change. Vascular/Lymphatic: Extensive lymphadenopathy throughout the abdomen and pelvis consistent with metastatic disease, increased in size since prior exam. Index lymph nodes are as follows: Celiac, image 17/2, 3.6 x 2.5 cm.  Previously 2.7 x 2.3 cm. Left para-aortic, image 30/2, 2.4 x 2.0 cm. Previously 2.2 x 1.6 cm. Diffuse atherosclerosis throughout the aorta and its branches. Reproductive: Prostate is unremarkable. Other: No free fluid or free intraperitoneal gas. No abdominal wall hernia. Musculoskeletal: There are no acute or destructive bony lesions. Extensive postsurgical changes are seen within the lower lumbar spine. Reconstructed images demonstrate no additional findings. IMPRESSION: 1. Multiple left basilar pleural and parenchymal lung masses, without significant change since prior exam. 2. Trace left pleural effusion. 3. Enlarging intra-abdominal and retroperitoneal lymphadenopathy as above, consistent with progressive metastatic disease. 4. No other acute intra-abdominal or intrapelvic process to explain the patient's left lower quadrant pain. Electronically Signed   By: Randa Ngo M.D.   On: 12/15/2021 22:00  ? ?VAS [place] LOWER EXTREMITY VENOUS (DVT) ? ?Result Date: 01/05/2022 ? Lower Venous DVT Study Patient Name:  LANARD ARGUIJO  Date of Exam:   01/05/2022 Medical  Rec #: [id number]         Accession #:     [id number] Date of Birth: [birth year]         Patient Gender: M Patient Age:   64 years Exam Location:  [facility] Procedure:      VAS [place] LOWER EXTREMITY VENOUS (DVT) Referring Phys: Bretta Bang Tanisha Lutes --------

## 2022-01-09 DEATH — deceased

## 2023-10-14 IMAGING — DX DG CHEST 1V PORT
1 series · 2 of 2 positions shown · non-contrast
Comparison: Chest plain film, dated June 25, 2020 and chest
CT, dated [DATE] [DATE], [DATE] [DATE]

CLINICAL DATA: Shortness of breath.

EXAM:
PORTABLE CHEST 1 VIEW

[Series 1: chest ap · 0.14mm/px · 2 of 2 slices shown]
[im 1/2]
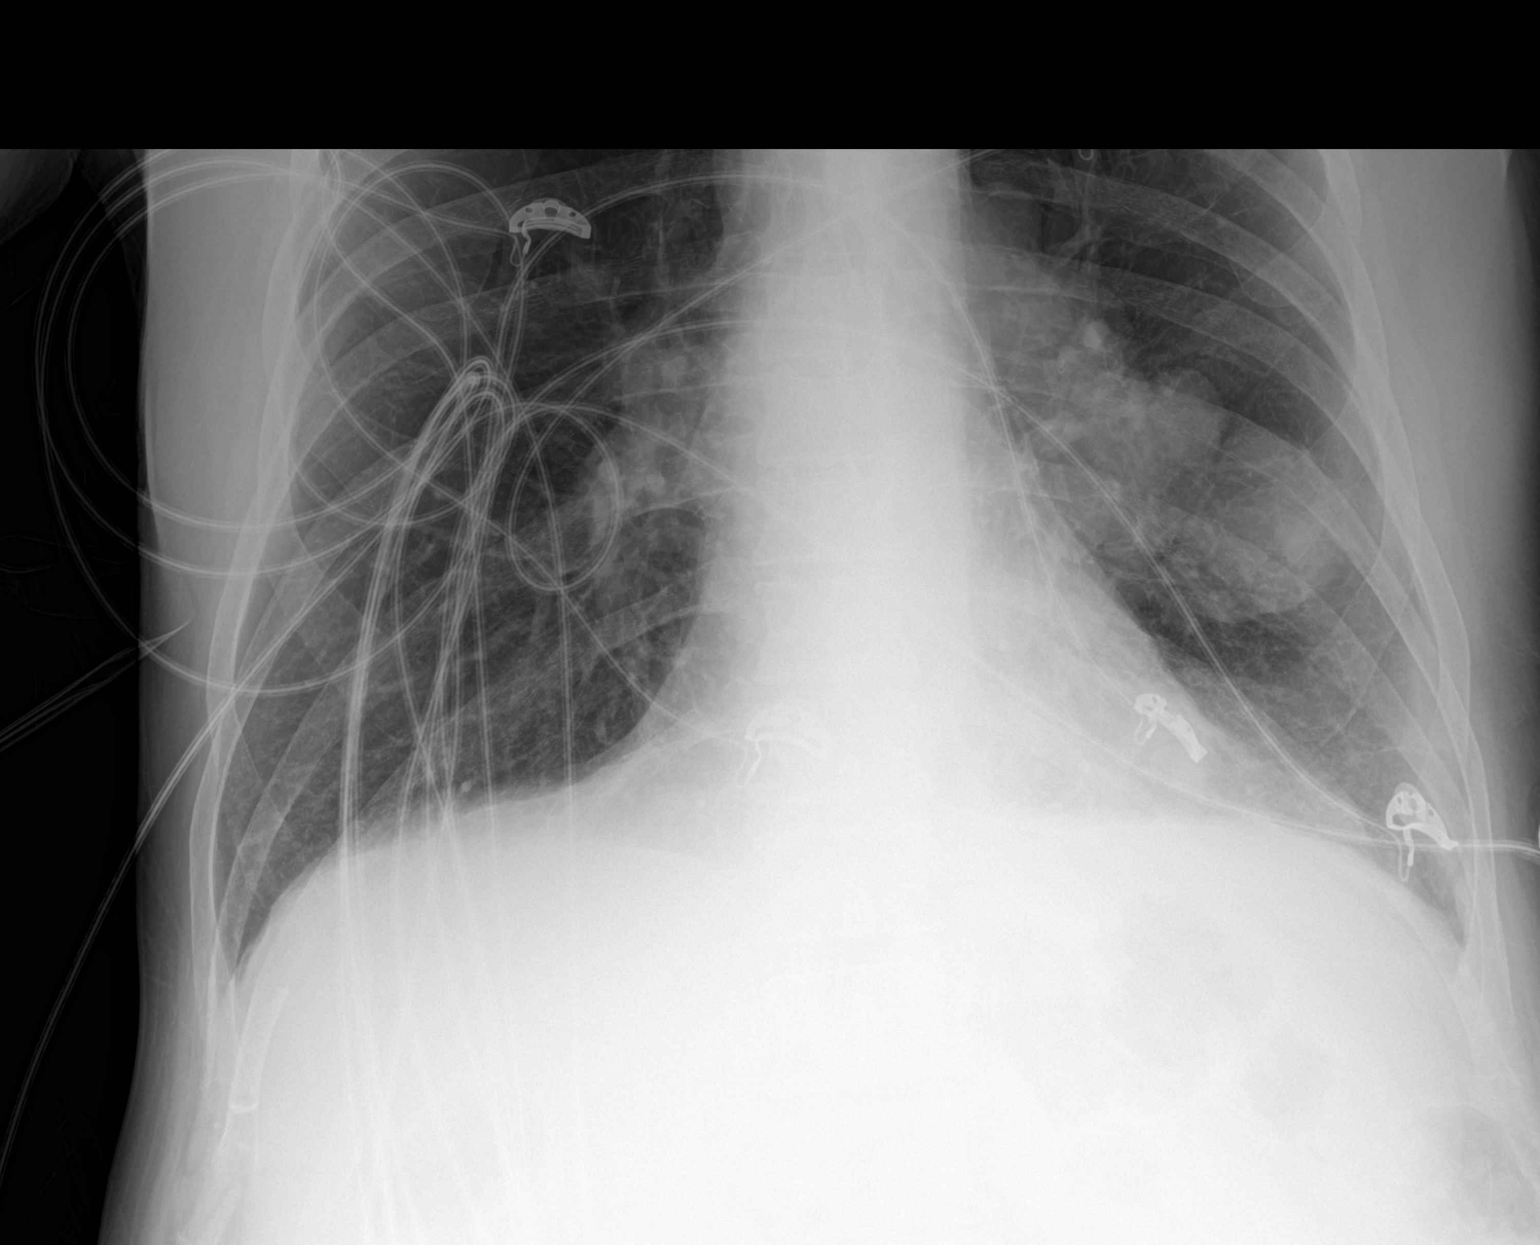
[im 2/2]
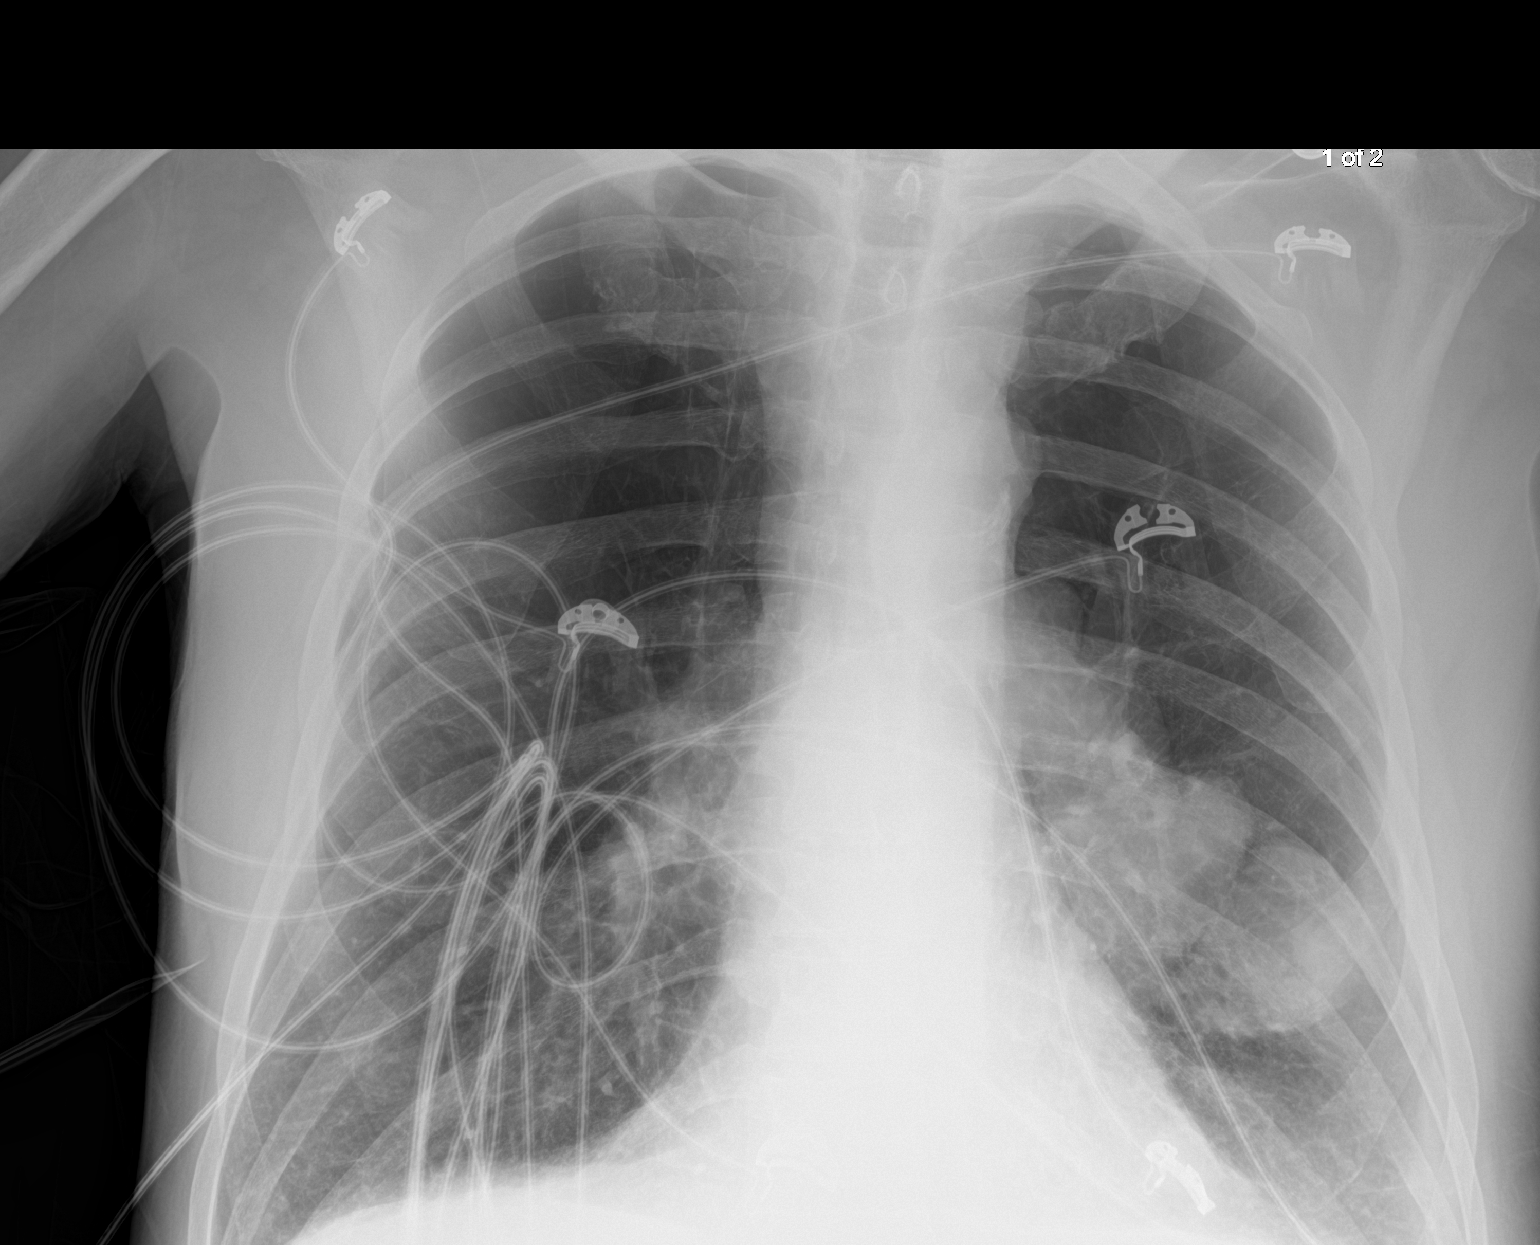

[2 of 2 positions shown; findings below may reference images not displayed]

FINDINGS: Marked severity emphysematous lung disease is seen involving the
bilateral upper lobes. Lobulated soft tissue masses are seen along
the left lung base and infrahilar region on the left. There is no
evidence of a pleural effusion or pneumothorax. The heart size and
mediastinal contours are within normal limits. The visualized
skeletal structures are unremarkable.
IMPRESSION: 1. Marked severity bilateral upper lobe emphysematous lung disease.
2. Lobulated soft tissue masses along the left lung base and left
infrahilar region, consistent with known malignancy.

## 2023-10-14 IMAGING — CT CT RENAL STONE PROTOCOL
2 of 4 series · 15 of 46 positions shown, 17 images · non-contrast
Comparison: 12/09/2021

CLINICAL DATA: Patient reports history of multiple malignancies not
otherwise specified, weakness, anorexia, left lower quadrant pain



[Series 2: axial st · axial · 0.74mm/px · z∈[-540,-135]mm · 12 of 91 slices shown, 14 images]
[im 5/91  soft-tissue]
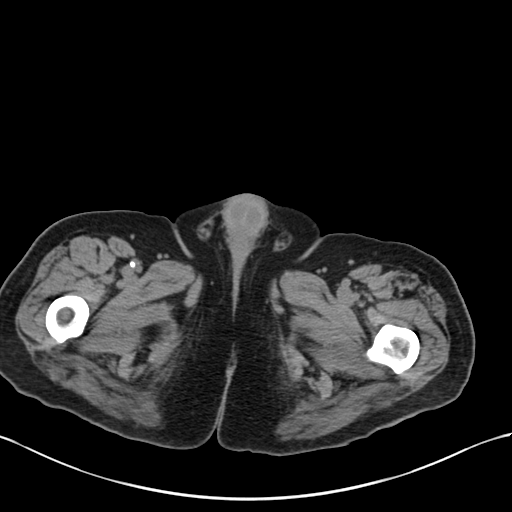
[im 5/91  bone]
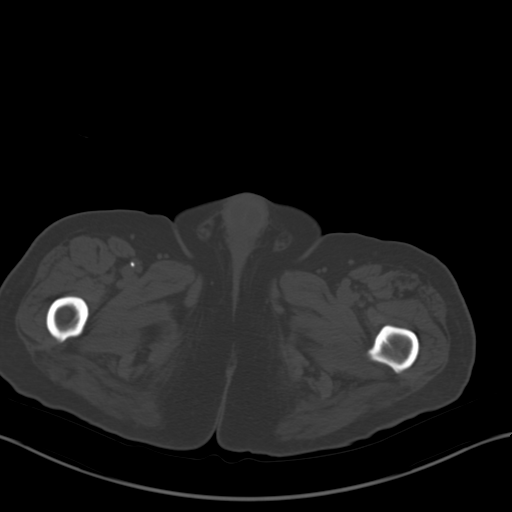
[im 14/91  soft-tissue]
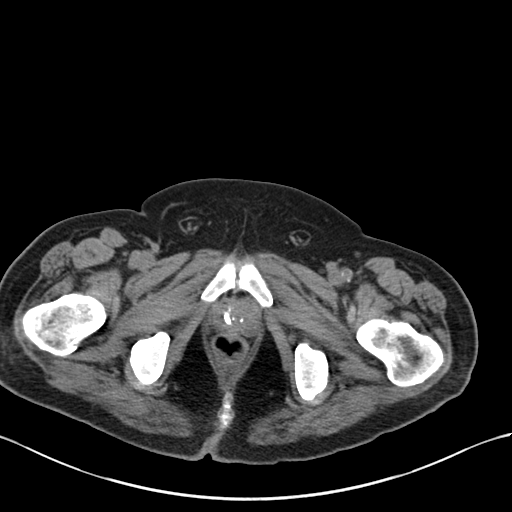
[im 19/91  soft-tissue]
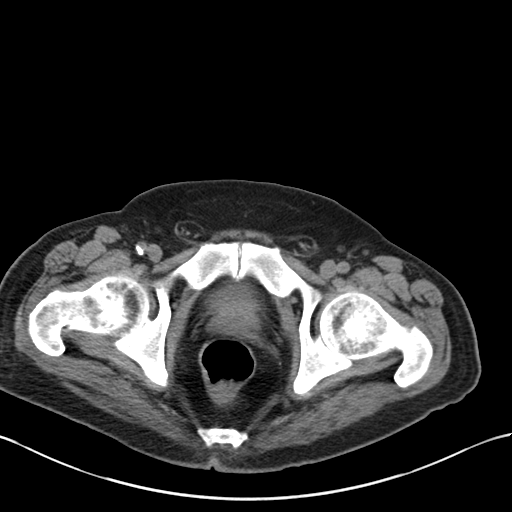
[im 28/91  soft-tissue]
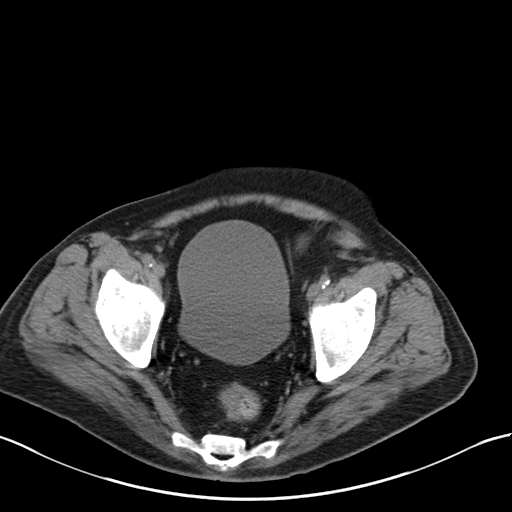
[im 37/91  soft-tissue]
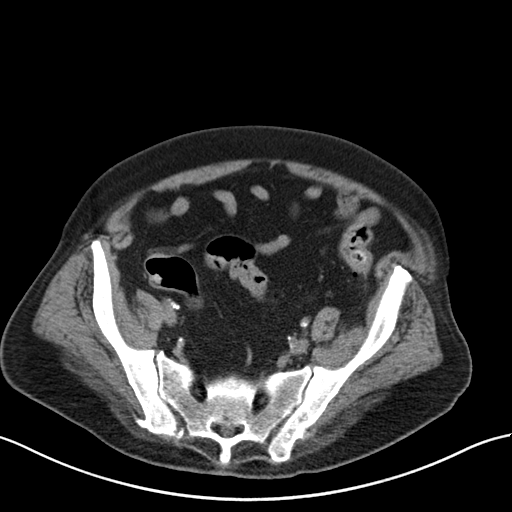
[im 41/91  soft-tissue]
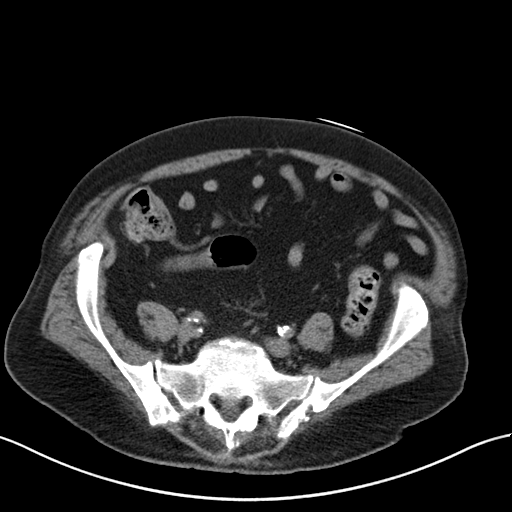
[im 50/91  soft-tissue]
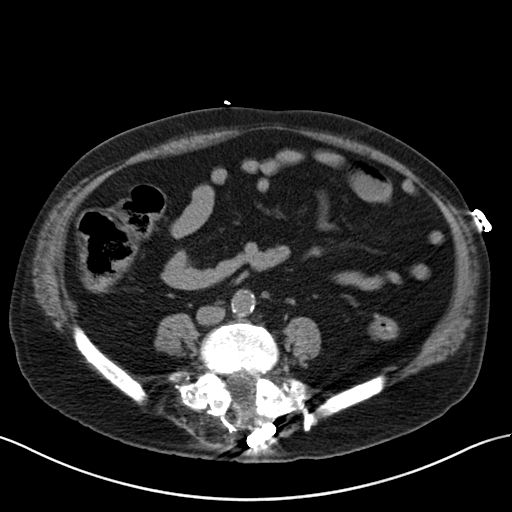
[im 55/91  soft-tissue]
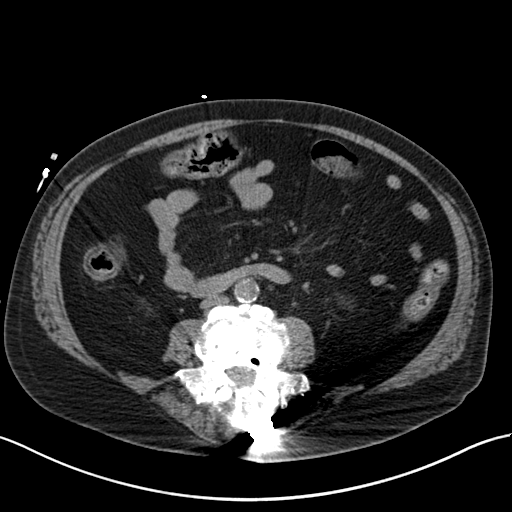
[im 64/91  soft-tissue]
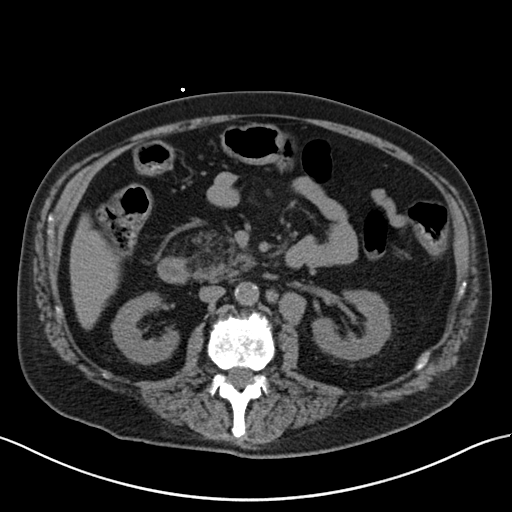
[im 64/91  bone]
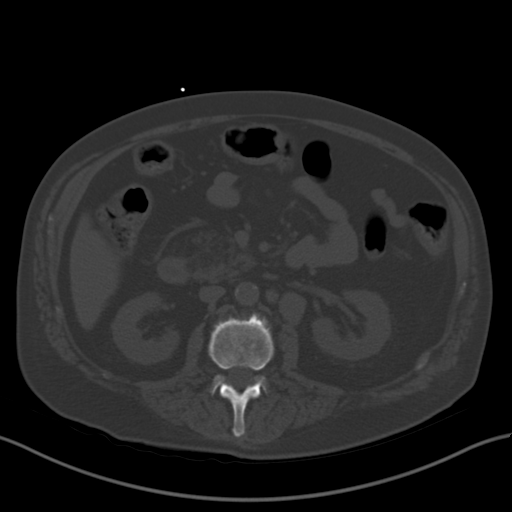
[im 73/91  soft-tissue]
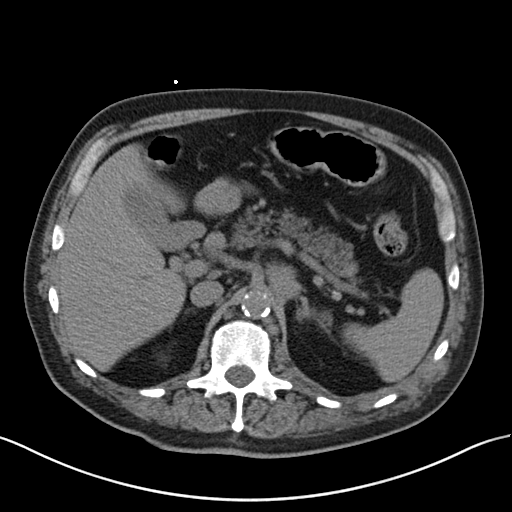
[im 77/91  soft-tissue]
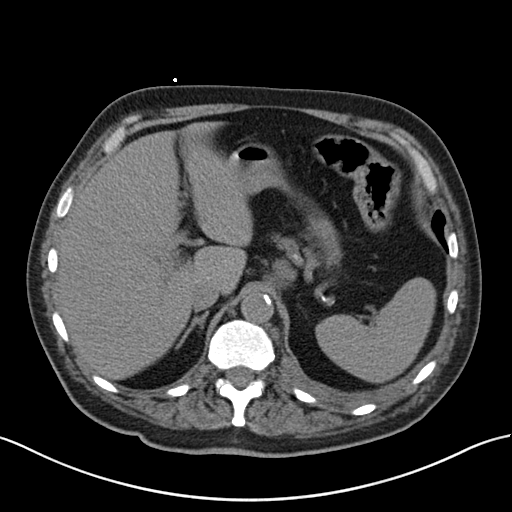
[im 86/91  soft-tissue]
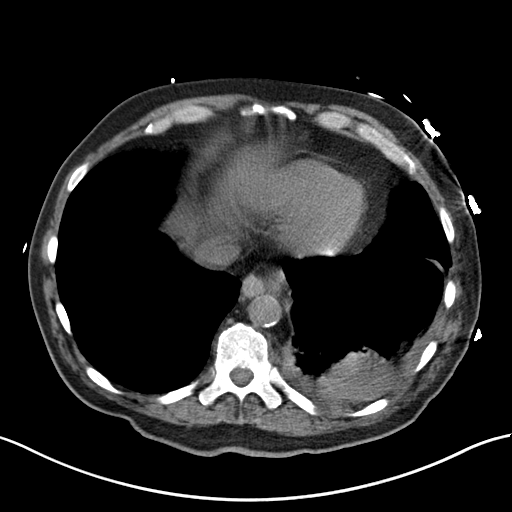

[Series 4: coronal · coronal · 0.75mm/px · 3 of 158 slices shown]
[im 53/158  soft-tissue]
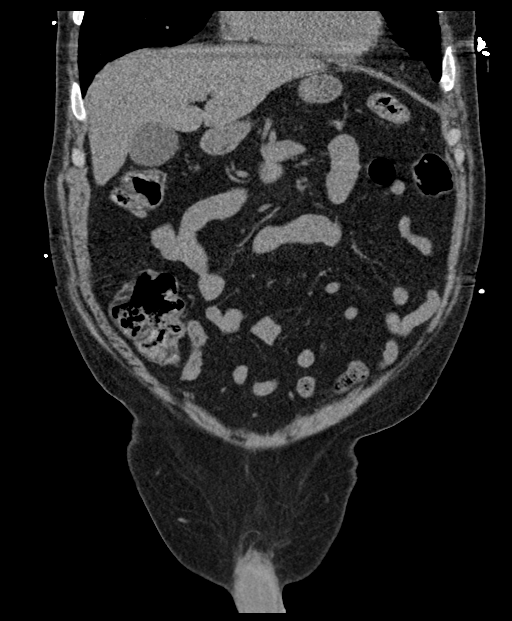
[im 70/158  soft-tissue]
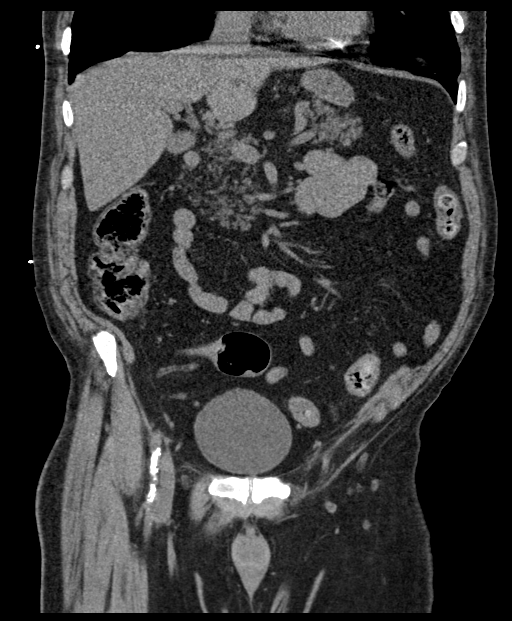
[im 88/158  soft-tissue]
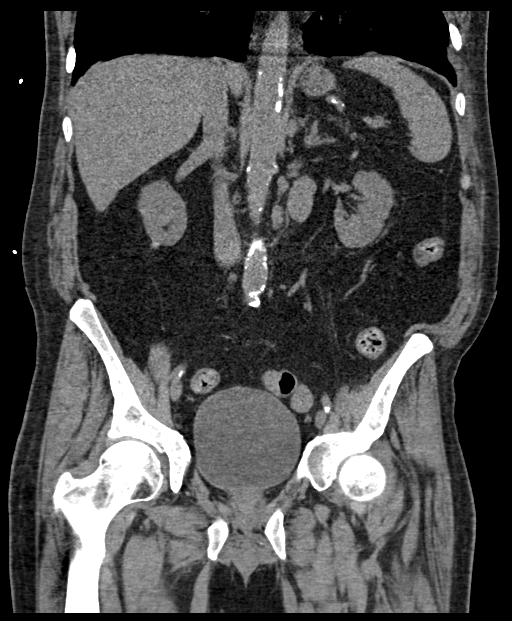

[15 of 46 positions shown; findings below may reference images not displayed]

FINDINGS: Lower chest: Multiple pleural and parenchymal masses are seen within
the left lower lobe, largest measuring 5.4 x 4.2 cm reference image
[DATE], consistent with known history of malignancy. Little change
since prior CT exam. Trace left pleural effusion.

Hepatobiliary: Unremarkable unenhanced appearance of the liver and
gallbladder.

Pancreas: Unremarkable unenhanced appearance.

Spleen: Unremarkable unenhanced appearance.

Adrenals/Urinary Tract: The adrenals are unremarkable. No urinary
tract calculi or obstructive uropathy within either kidney. The
bladder is moderately distended without focal abnormality.

Stomach/Bowel: No bowel obstruction or ileus. Normal appendix right
lower quadrant. No bowel wall thickening or inflammatory change.

Vascular/Lymphatic: Extensive lymphadenopathy throughout the abdomen
and pelvis consistent with metastatic disease, increased in size
since prior exam. Index lymph nodes are as follows:

Celiac, image [DATE], 3.6 x 2.5 cm.  Previously 2.7 x 2.3 cm.

Left para-aortic, image [DATE], 2.4 x 2.0 cm. Previously 2.2 x 1.6 cm.

Diffuse atherosclerosis throughout the aorta and its branches.

Reproductive: Prostate is unremarkable.

Other: No free fluid or free intraperitoneal gas. No abdominal wall
hernia.

Musculoskeletal: There are no acute or destructive bony lesions.
Extensive postsurgical changes are seen within the lower lumbar
spine. Reconstructed images demonstrate no additional findings.
IMPRESSION: 1. Multiple left basilar pleural and parenchymal lung masses,
without significant change since prior exam.
2. Trace left pleural effusion.
3. Enlarging intra-abdominal and retroperitoneal lymphadenopathy as
above, consistent with progressive metastatic disease.
4. No other acute intra-abdominal or intrapelvic process to explain
the patient's left lower quadrant pain.

## 2023-10-18 IMAGING — DX DG CHEST 1V PORT
2 series · 2 of 2 positions shown · non-contrast
Comparison: Chest radiographs 01/01/2022

CLINICAL DATA: Weakness and cough.

EXAM:
PORTABLE CHEST 1 VIEW

[chest ap (1 of 2)]
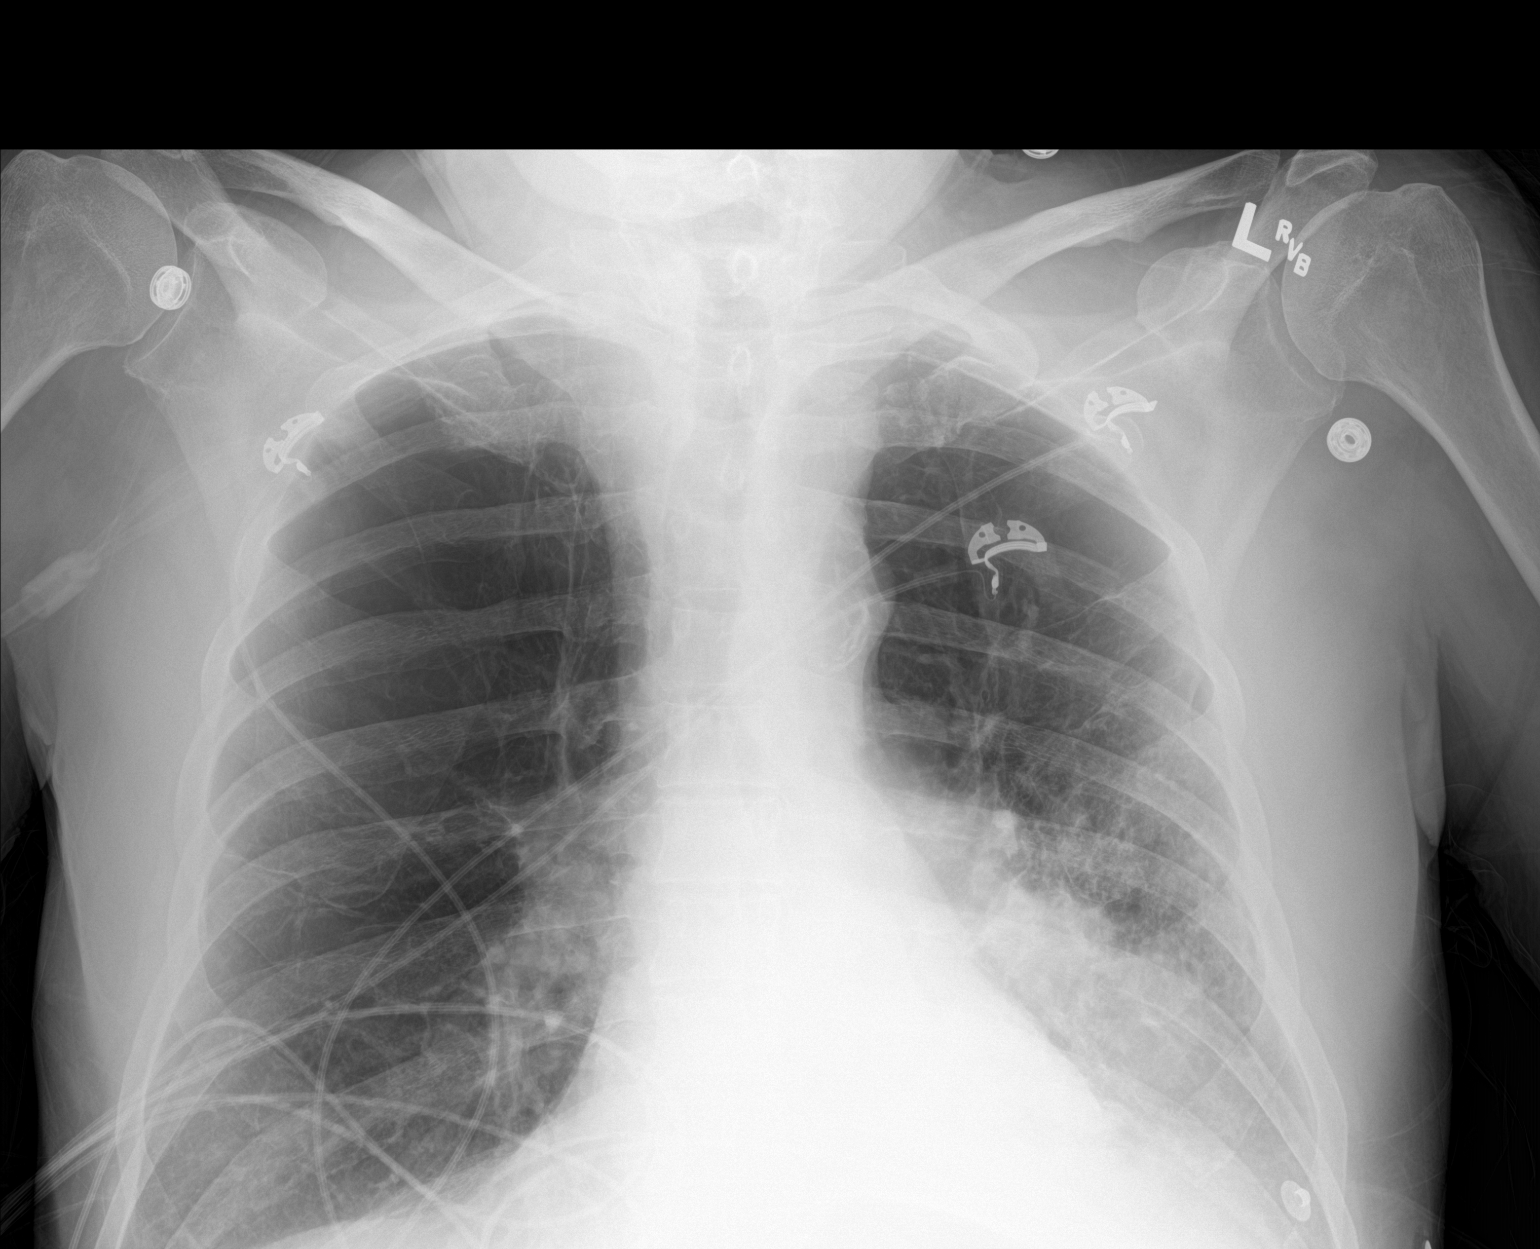

[chest ap (2 of 2)]
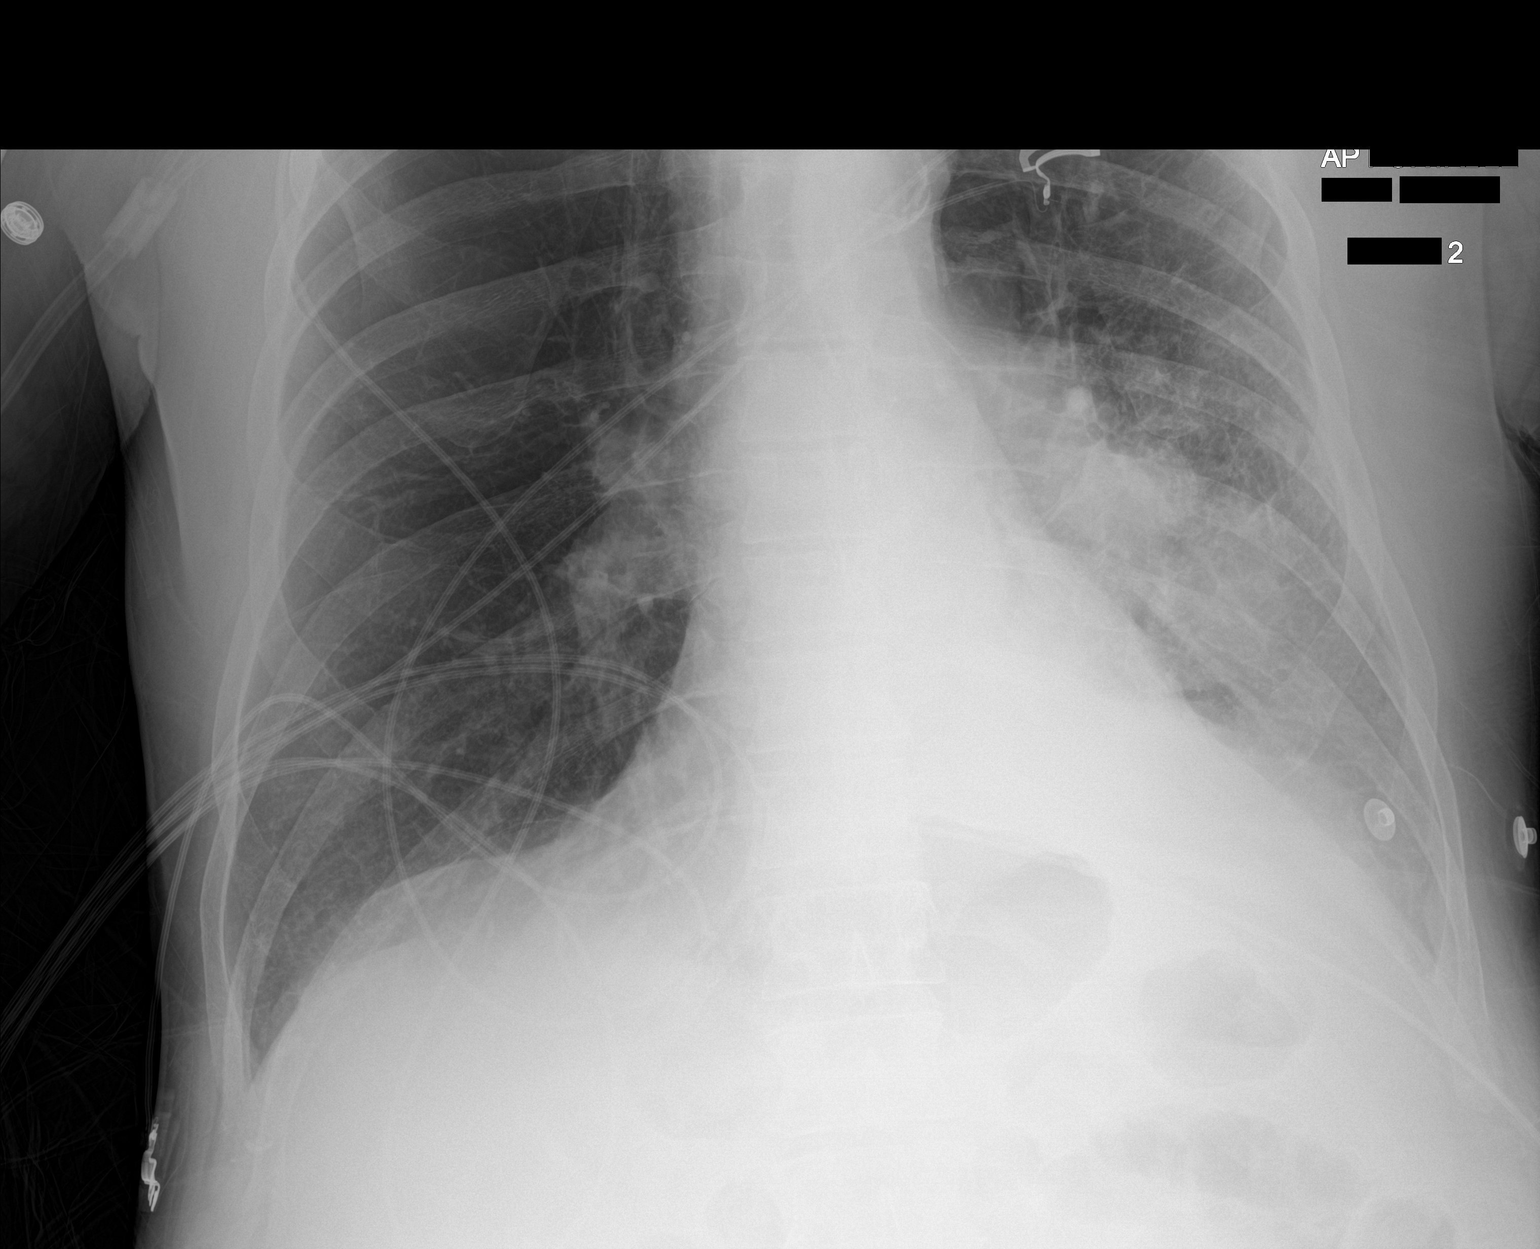

[2 of 2 positions shown; findings below may reference images not displayed]

FINDINGS: The cardiomediastinal silhouette is unchanged with normal heart
size. Severe emphysema is again noted. Multiple masses are again
noted in the left mid and lower lung, and there is increased
surrounding interstitial and airspace opacity including denser
opacity in the retrocardiac region. No large pleural effusion or
pneumothorax is identified.
IMPRESSION: 1. Increased left mid and lower lung opacities which could reflect
pneumonia with denser consolidation or collapse in the left lower
lobe.
2. Severe emphysema and known left lung masses.
